# Patient Record
Sex: Female | Born: 1960 | Race: White | Hispanic: No | Marital: Single | Smoking: Current every day smoker
Health system: Southern US, Community
[De-identification: ages and names within clinical notes are randomized; demographics above are authoritative.]

## PROBLEM LIST (undated history)

## (undated) DIAGNOSIS — I671 Cerebral aneurysm, nonruptured: Secondary | ICD-10-CM

## (undated) DIAGNOSIS — J449 Chronic obstructive pulmonary disease, unspecified: Secondary | ICD-10-CM

## (undated) HISTORY — PX: BRAIN SURGERY: SHX531

---

## 2008-11-18 ENCOUNTER — Emergency Department: Payer: Self-pay | Admitting: Internal Medicine

## 2008-11-22 ENCOUNTER — Ambulatory Visit: Payer: Self-pay | Admitting: Internal Medicine

## 2009-01-12 ENCOUNTER — Ambulatory Visit: Payer: Self-pay

## 2010-01-16 ENCOUNTER — Ambulatory Visit: Payer: Self-pay | Admitting: Internal Medicine

## 2011-01-18 ENCOUNTER — Ambulatory Visit: Payer: Self-pay | Admitting: Internal Medicine

## 2012-01-24 ENCOUNTER — Ambulatory Visit: Payer: Self-pay | Admitting: Internal Medicine

## 2012-02-06 ENCOUNTER — Ambulatory Visit: Payer: Self-pay | Admitting: Internal Medicine

## 2014-12-01 ENCOUNTER — Ambulatory Visit
Admit: 2014-12-01 | Disposition: A | Discharge status: Home / Self Care | Payer: Self-pay | Attending: Family Medicine | Admitting: Family Medicine

## 2016-02-05 ENCOUNTER — Emergency Department: Payer: Medicare (Managed Care)

## 2016-02-05 ENCOUNTER — Inpatient Hospital Stay
Admission: EM | Admit: 2016-02-05 | Discharge: 2016-02-25 | DRG: 871 | Disposition: E | Payer: Medicare (Managed Care) | Attending: Pulmonary Disease | Admitting: Pulmonary Disease

## 2016-02-05 DIAGNOSIS — S42301A Unspecified fracture of shaft of humerus, right arm, initial encounter for closed fracture: Secondary | ICD-10-CM | POA: Diagnosis present

## 2016-02-05 DIAGNOSIS — N179 Acute kidney failure, unspecified: Secondary | ICD-10-CM | POA: Diagnosis present

## 2016-02-05 DIAGNOSIS — Z9181 History of falling: Secondary | ICD-10-CM | POA: Diagnosis not present

## 2016-02-05 DIAGNOSIS — E876 Hypokalemia: Secondary | ICD-10-CM

## 2016-02-05 DIAGNOSIS — E860 Dehydration: Secondary | ICD-10-CM | POA: Diagnosis present

## 2016-02-05 DIAGNOSIS — I469 Cardiac arrest, cause unspecified: Secondary | ICD-10-CM | POA: Diagnosis not present

## 2016-02-05 DIAGNOSIS — R590 Localized enlarged lymph nodes: Secondary | ICD-10-CM | POA: Insufficient documentation

## 2016-02-05 DIAGNOSIS — J9602 Acute respiratory failure with hypercapnia: Secondary | ICD-10-CM | POA: Diagnosis present

## 2016-02-05 DIAGNOSIS — Z880 Allergy status to penicillin: Secondary | ICD-10-CM

## 2016-02-05 DIAGNOSIS — F17210 Nicotine dependence, cigarettes, uncomplicated: Secondary | ICD-10-CM | POA: Diagnosis present

## 2016-02-05 DIAGNOSIS — W06XXXA Fall from bed, initial encounter: Secondary | ICD-10-CM | POA: Diagnosis present

## 2016-02-05 DIAGNOSIS — J690 Pneumonitis due to inhalation of food and vomit: Secondary | ICD-10-CM | POA: Diagnosis not present

## 2016-02-05 DIAGNOSIS — E872 Acidosis: Secondary | ICD-10-CM | POA: Diagnosis present

## 2016-02-05 DIAGNOSIS — Z66 Do not resuscitate: Secondary | ICD-10-CM | POA: Diagnosis not present

## 2016-02-05 DIAGNOSIS — M899 Disorder of bone, unspecified: Secondary | ICD-10-CM | POA: Diagnosis not present

## 2016-02-05 DIAGNOSIS — Z978 Presence of other specified devices: Secondary | ICD-10-CM

## 2016-02-05 DIAGNOSIS — S42309A Unspecified fracture of shaft of humerus, unspecified arm, initial encounter for closed fracture: Secondary | ICD-10-CM

## 2016-02-05 DIAGNOSIS — B961 Klebsiella pneumoniae [K. pneumoniae] as the cause of diseases classified elsewhere: Secondary | ICD-10-CM | POA: Diagnosis present

## 2016-02-05 DIAGNOSIS — S329XXA Fracture of unspecified parts of lumbosacral spine and pelvis, initial encounter for closed fracture: Secondary | ICD-10-CM

## 2016-02-05 DIAGNOSIS — J9601 Acute respiratory failure with hypoxia: Secondary | ICD-10-CM | POA: Diagnosis present

## 2016-02-05 DIAGNOSIS — R570 Cardiogenic shock: Secondary | ICD-10-CM | POA: Diagnosis not present

## 2016-02-05 DIAGNOSIS — G9341 Metabolic encephalopathy: Secondary | ICD-10-CM | POA: Diagnosis present

## 2016-02-05 DIAGNOSIS — A419 Sepsis, unspecified organism: Principal | ICD-10-CM

## 2016-02-05 DIAGNOSIS — Z4659 Encounter for fitting and adjustment of other gastrointestinal appliance and device: Secondary | ICD-10-CM

## 2016-02-05 DIAGNOSIS — J449 Chronic obstructive pulmonary disease, unspecified: Secondary | ICD-10-CM | POA: Diagnosis present

## 2016-02-05 DIAGNOSIS — Z8673 Personal history of transient ischemic attack (TIA), and cerebral infarction without residual deficits: Secondary | ICD-10-CM

## 2016-02-05 DIAGNOSIS — N390 Urinary tract infection, site not specified: Secondary | ICD-10-CM | POA: Diagnosis present

## 2016-02-05 DIAGNOSIS — R6521 Severe sepsis with septic shock: Secondary | ICD-10-CM | POA: Diagnosis present

## 2016-02-05 DIAGNOSIS — G931 Anoxic brain damage, not elsewhere classified: Secondary | ICD-10-CM | POA: Diagnosis present

## 2016-02-05 DIAGNOSIS — J9600 Acute respiratory failure, unspecified whether with hypoxia or hypercapnia: Secondary | ICD-10-CM | POA: Diagnosis present

## 2016-02-05 DIAGNOSIS — C779 Secondary and unspecified malignant neoplasm of lymph node, unspecified: Secondary | ICD-10-CM | POA: Diagnosis present

## 2016-02-05 DIAGNOSIS — C9000 Multiple myeloma not having achieved remission: Secondary | ICD-10-CM

## 2016-02-05 DIAGNOSIS — Z9889 Other specified postprocedural states: Secondary | ICD-10-CM

## 2016-02-05 DIAGNOSIS — R531 Weakness: Secondary | ICD-10-CM

## 2016-02-05 DIAGNOSIS — Z87820 Personal history of traumatic brain injury: Secondary | ICD-10-CM | POA: Diagnosis not present

## 2016-02-05 DIAGNOSIS — R748 Abnormal levels of other serum enzymes: Secondary | ICD-10-CM

## 2016-02-05 DIAGNOSIS — Z7982 Long term (current) use of aspirin: Secondary | ICD-10-CM

## 2016-02-05 DIAGNOSIS — Z452 Encounter for adjustment and management of vascular access device: Secondary | ICD-10-CM

## 2016-02-05 DIAGNOSIS — Z01818 Encounter for other preprocedural examination: Secondary | ICD-10-CM

## 2016-02-05 DIAGNOSIS — W19XXXA Unspecified fall, initial encounter: Secondary | ICD-10-CM

## 2016-02-05 HISTORY — DX: Cerebral aneurysm, nonruptured: I67.1

## 2016-02-05 HISTORY — DX: Chronic obstructive pulmonary disease, unspecified: J44.9

## 2016-02-05 LAB — TROPONIN I: TROPONIN I: 0.04 ng/mL — AB (ref <0.031)

## 2016-02-05 LAB — COMPREHENSIVE METABOLIC PANEL
ALT: 55 U/L — AB (ref 14–54)
AST: 89 U/L — AB (ref 15–41)
Albumin: 2.7 g/dL — ABNORMAL LOW (ref 3.5–5.0)
Alkaline Phosphatase: 116 U/L (ref 38–126)
Anion gap: 14 (ref 5–15)
BILIRUBIN TOTAL: 0.2 mg/dL — AB (ref 0.3–1.2)
BUN: 30 mg/dL — AB (ref 6–20)
CHLORIDE: 105 mmol/L (ref 101–111)
CO2: 19 mmol/L — ABNORMAL LOW (ref 22–32)
CREATININE: 1.43 mg/dL — AB (ref 0.44–1.00)
Calcium: 9.3 mg/dL (ref 8.9–10.3)
GFR, EST AFRICAN AMERICAN: 46 mL/min — AB (ref >60)
GFR, EST NON AFRICAN AMERICAN: 40 mL/min — AB (ref >60)
Glucose, Bld: 105 mg/dL — ABNORMAL HIGH (ref 65–99)
POTASSIUM: 2.4 mmol/L — AB (ref 3.5–5.1)
Sodium: 138 mmol/L (ref 135–145)
TOTAL PROTEIN: 6.7 g/dL (ref 6.5–8.1)

## 2016-02-05 LAB — URINALYSIS COMPLETE WITH MICROSCOPIC (ARMC ONLY)
Bilirubin Urine: NEGATIVE
Glucose, UA: NEGATIVE mg/dL
KETONES UR: NEGATIVE mg/dL
NITRITE: NEGATIVE
PROTEIN: 30 mg/dL — AB
Specific Gravity, Urine: 1.014 (ref 1.005–1.030)
pH: 5 (ref 5.0–8.0)

## 2016-02-05 LAB — DIFFERENTIAL
BASOS ABS: 0.1 10*3/uL (ref 0–0.1)
Basophils Relative: 0 %
EOS ABS: 0 10*3/uL (ref 0–0.7)
Eosinophils Relative: 0 %
LYMPHS ABS: 2 10*3/uL (ref 1.0–3.6)
Lymphocytes Relative: 9 %
MONO ABS: 1.1 10*3/uL — AB (ref 0.2–0.9)
Neutro Abs: 18.8 10*3/uL — ABNORMAL HIGH (ref 1.4–6.5)
Neutrophils Relative %: 86 %

## 2016-02-05 LAB — CBC
HEMATOCRIT: 28.9 % — AB (ref 35.0–47.0)
HEMOGLOBIN: 9.3 g/dL — AB (ref 12.0–16.0)
MCH: 25.9 pg — ABNORMAL LOW (ref 26.0–34.0)
MCHC: 32 g/dL (ref 32.0–36.0)
MCV: 80.7 fL (ref 80.0–100.0)
Platelets: 513 10*3/uL — ABNORMAL HIGH (ref 150–440)
RBC: 3.58 MIL/uL — ABNORMAL LOW (ref 3.80–5.20)
RDW: 15.2 % — AB (ref 11.5–14.5)
WBC: 21.9 10*3/uL — ABNORMAL HIGH (ref 3.6–11.0)

## 2016-02-05 LAB — HEMOGLOBIN AND HEMATOCRIT, BLOOD
HEMATOCRIT: 27.3 % — AB (ref 35.0–47.0)
HEMOGLOBIN: 8.7 g/dL — AB (ref 12.0–16.0)

## 2016-02-05 LAB — LACTIC ACID, PLASMA
Lactic Acid, Venous: 1 mmol/L (ref 0.5–2.0)
Lactic Acid, Venous: 1.7 mmol/L (ref 0.5–2.0)

## 2016-02-05 LAB — CK: CK TOTAL: 869 U/L — AB (ref 38–234)

## 2016-02-05 LAB — PROTIME-INR
INR: 1.22
Prothrombin Time: 15.6 seconds — ABNORMAL HIGH (ref 11.4–15.0)

## 2016-02-05 LAB — GLUCOSE, CAPILLARY: GLUCOSE-CAPILLARY: 112 mg/dL — AB (ref 65–99)

## 2016-02-05 LAB — APTT: aPTT: 24 seconds (ref 24–36)

## 2016-02-05 MED ORDER — HYDROCODONE-ACETAMINOPHEN 5-325 MG PO TABS
ORAL_TABLET | ORAL | Status: AC
  Administered 2016-02-05: 1 via ORAL
  Filled 2016-02-05: qty 2

## 2016-02-05 MED ORDER — POTASSIUM CHLORIDE IN NACL 40-0.9 MEQ/L-% IV SOLN
INTRAVENOUS | Status: DC
  Administered 2016-02-05 – 2016-02-08 (×5): 75 mL/h via INTRAVENOUS
  Filled 2016-02-05 (×7): qty 1000

## 2016-02-05 MED ORDER — POTASSIUM CHLORIDE 10 MEQ/100ML IV SOLN
10.0000 meq | Freq: Once | INTRAVENOUS | Status: AC
  Administered 2016-02-05: 23:00:00 10 meq via INTRAVENOUS
  Filled 2016-02-05: qty 100

## 2016-02-05 MED ORDER — TIOTROPIUM BROMIDE MONOHYDRATE 18 MCG IN CAPS
18.0000 ug | ORAL_CAPSULE | Freq: Every day | RESPIRATORY_TRACT | Status: DC
  Administered 2016-02-05 – 2016-02-06 (×2): 18 ug via RESPIRATORY_TRACT
  Filled 2016-02-05: qty 5

## 2016-02-05 MED ORDER — PANTOPRAZOLE SODIUM 40 MG PO TBEC
40.0000 mg | DELAYED_RELEASE_TABLET | Freq: Every day | ORAL | Status: DC
  Administered 2016-02-05 – 2016-02-08 (×4): 40 mg via ORAL
  Filled 2016-02-05 (×4): qty 1

## 2016-02-05 MED ORDER — SODIUM CHLORIDE 0.9 % IV BOLUS (SEPSIS)
1000.0000 mL | Freq: Once | INTRAVENOUS | Status: AC
  Administered 2016-02-05: 23:00:00 1000 mL via INTRAVENOUS

## 2016-02-05 MED ORDER — DEXTROSE 5 % IV SOLN
1.0000 g | Freq: Three times a day (TID) | INTRAVENOUS | Status: DC
  Administered 2016-02-06: 1 g via INTRAVENOUS
  Filled 2016-02-05 (×3): qty 1

## 2016-02-05 MED ORDER — OXYCODONE-ACETAMINOPHEN 5-325 MG PO TABS
1.0000 | ORAL_TABLET | ORAL | Status: AC
  Administered 2016-02-05: 1 via ORAL
  Filled 2016-02-05: qty 1

## 2016-02-05 MED ORDER — ACETAMINOPHEN 325 MG PO TABS: 650.0000 mg | ORAL_TABLET | Freq: Four times a day (QID) | ORAL | Status: DC | PRN

## 2016-02-05 MED ORDER — DOCUSATE SODIUM 100 MG PO CAPS
100.0000 mg | ORAL_CAPSULE | Freq: Two times a day (BID) | ORAL | Status: DC
  Administered 2016-02-05 – 2016-02-07 (×4): 100 mg via ORAL
  Filled 2016-02-05 (×5): qty 1

## 2016-02-05 MED ORDER — ACETAMINOPHEN 650 MG RE SUPP: 650.0000 mg | Freq: Four times a day (QID) | RECTAL | Status: DC | PRN

## 2016-02-05 MED ORDER — LEVOFLOXACIN IN D5W 750 MG/150ML IV SOLN
750.0000 mg | Freq: Once | INTRAVENOUS | Status: AC
  Administered 2016-02-05: 750 mg via INTRAVENOUS
  Filled 2016-02-05: qty 150

## 2016-02-05 MED ORDER — HEPARIN SODIUM (PORCINE) 5000 UNIT/ML IJ SOLN
5000.0000 [IU] | Freq: Three times a day (TID) | INTRAMUSCULAR | Status: DC
  Administered 2016-02-05 – 2016-02-07 (×5): 5000 [IU] via SUBCUTANEOUS
  Filled 2016-02-05 (×5): qty 1

## 2016-02-05 MED ORDER — SODIUM CHLORIDE 0.9 % IV BOLUS (SEPSIS)
1000.0000 mL | Freq: Once | INTRAVENOUS | Status: AC
  Administered 2016-02-05: 1000 mL via INTRAVENOUS

## 2016-02-05 MED ORDER — VITAMIN D (ERGOCALCIFEROL) 1.25 MG (50000 UNIT) PO CAPS: 50000.0000 [IU] | ORAL_CAPSULE | ORAL | Status: DC

## 2016-02-05 MED ORDER — LEVOFLOXACIN IN D5W 750 MG/150ML IV SOLN: 750.0000 mg | INTRAVENOUS | Status: DC

## 2016-02-05 MED ORDER — ONDANSETRON HCL 4 MG/2ML IJ SOLN: 4.0000 mg | Freq: Four times a day (QID) | INTRAMUSCULAR | Status: DC | PRN

## 2016-02-05 MED ORDER — MAGNESIUM SULFATE 2 GM/50ML IV SOLN
2.0000 g | Freq: Once | INTRAVENOUS | Status: AC
  Administered 2016-02-05: 2 g via INTRAVENOUS
  Filled 2016-02-05: qty 50

## 2016-02-05 MED ORDER — ONDANSETRON HCL 4 MG PO TABS
4.0000 mg | ORAL_TABLET | Freq: Four times a day (QID) | ORAL | Status: DC | PRN
  Administered 2016-02-07 – 2016-02-08 (×2): 4 mg via ORAL
  Filled 2016-02-05: qty 1

## 2016-02-05 MED ORDER — ASPIRIN EC 81 MG PO TBEC
81.0000 mg | DELAYED_RELEASE_TABLET | Freq: Every day | ORAL | Status: DC
  Administered 2016-02-05 – 2016-02-08 (×4): 81 mg via ORAL
  Filled 2016-02-05 (×4): qty 1

## 2016-02-05 MED ORDER — DEXTROSE 5 % IV SOLN
2.0000 g | Freq: Once | INTRAVENOUS | Status: AC
  Administered 2016-02-05: 2 g via INTRAVENOUS
  Filled 2016-02-05: qty 2

## 2016-02-05 MED ORDER — MOMETASONE FURO-FORMOTEROL FUM 200-5 MCG/ACT IN AERO
2.0000 | INHALATION_SPRAY | Freq: Two times a day (BID) | RESPIRATORY_TRACT | Status: DC
  Administered 2016-02-05 – 2016-02-06 (×3): 2 via RESPIRATORY_TRACT
  Filled 2016-02-05: qty 8.8

## 2016-02-05 MED ORDER — SODIUM CHLORIDE 0.9% FLUSH
3.0000 mL | Freq: Two times a day (BID) | INTRAVENOUS | Status: DC
  Administered 2016-02-05 – 2016-02-08 (×6): 3 mL via INTRAVENOUS

## 2016-02-05 MED ORDER — POTASSIUM CHLORIDE CRYS ER 20 MEQ PO TBCR
40.0000 meq | EXTENDED_RELEASE_TABLET | Freq: Once | ORAL | Status: AC
  Administered 2016-02-05: 40 meq via ORAL
  Filled 2016-02-05: qty 2

## 2016-02-05 MED ORDER — MORPHINE SULFATE (PF) 2 MG/ML IV SOLN
2.0000 mg | INTRAVENOUS | Status: DC | PRN
  Administered 2016-02-05 – 2016-02-07 (×3): 2 mg via INTRAVENOUS
  Filled 2016-02-05 (×3): qty 1

## 2016-02-05 MED ORDER — IPRATROPIUM-ALBUTEROL 0.5-2.5 (3) MG/3ML IN SOLN
3.0000 mL | RESPIRATORY_TRACT | Status: DC | PRN
  Administered 2016-02-08: 10:00:00 3 mL via RESPIRATORY_TRACT
  Filled 2016-02-05: qty 3

## 2016-02-05 MED ORDER — SERTRALINE HCL 50 MG PO TABS
100.0000 mg | ORAL_TABLET | Freq: Every day | ORAL | Status: DC
  Administered 2016-02-05 – 2016-02-07 (×3): 100 mg via ORAL
  Filled 2016-02-05 (×3): qty 1

## 2016-02-05 MED ORDER — LOPERAMIDE HCL 2 MG PO CAPS: 2.0000 mg | ORAL_CAPSULE | Freq: Two times a day (BID) | ORAL | Status: DC | PRN

## 2016-02-05 MED ORDER — HYDROCODONE-ACETAMINOPHEN 5-325 MG PO TABS
1.0000 | ORAL_TABLET | ORAL | Status: DC | PRN
  Administered 2016-02-05: 1 via ORAL
  Administered 2016-02-05 – 2016-02-08 (×7): 2 via ORAL
  Filled 2016-02-05: qty 1
  Filled 2016-02-05 (×6): qty 2

## 2016-02-05 MED ORDER — BISACODYL 10 MG RE SUPP: 10.0000 mg | Freq: Every day | RECTAL | Status: DC | PRN

## 2016-02-05 NOTE — H&P (Signed)
History and Physical    Leah Wyatt [id number] DOB: 12-Jan-1961 DOA: 02/13/2016  Referring physician: Dr. Jacqualine Code PCP: No primary care provider on file.  Specialists:none  Chief Complaint: weakness, right arm pain  HPI: Leah Wyatt is a 55 y.o. female has a past medical history significant for COPD and previous CVA with aneurysm clipping now with progressive weakness and right arm pain. Pt fell out of bed earlier today. No fever. No N/V/D. Denies CP or SOB. Was brought to the ER due to weakness and arm pain where she was found to be hypotensive with WBC=22k and UTI. Also noted was a right humeral fracture and K+=2.4. She is now admitted.  Review of Systems: The patient denies anorexia, fever, weight loss,, vision loss, decreased hearing, hoarseness, chest pain, syncope, dyspnea on exertion, peripheral edema, balance deficits, hemoptysis, abdominal pain, melena, hematochezia, severe indigestion/heartburn, hematuria, incontinence, genital sores, muscle weakness, suspicious skin lesions, transient blindness, difficulty walking, depression, unusual weight change, abnormal bleeding, enlarged lymph nodes, angioedema, and breast masses.   Past Medical History  Diagnosis Date  . Brain aneurysm   . COPD (chronic obstructive pulmonary disease) [facility])    Past Surgical History  Procedure Laterality Date  . Brain surgery     Social History:  reports that she has been smoking.  She does not have any smokeless tobacco history on file. She reports that she does not drink alcohol. Her drug history is not on file.  Allergies  Allergen Reactions  . Penicillins     Family History  Problem Relation Age of Onset  . Family history unknown: Yes    Prior to Admission medications   Medication Sig Start Date End Date Taking? Authorizing Provider  loperamide (IMODIUM) 2 MG capsule Take by mouth as needed for diarrhea or loose stools.   Yes Historical Provider, MD   Physical  Exam: Filed Vitals:   02/06/2016 1608 02/12/2016 1609 02/20/2016 1625  BP: 94/50    Pulse: 88    Temp:   97.9 F (36.6 C)  Resp: 16    SpO2: 98% 98%      General:  No apparent distress, WDWN, [place]/AT  Eyes: PERRL, EOMI, no scleral icterus, conjunctiva clear  ENT: moist oropharynx without lesions, TM's benign, dentition fair  Neck: supple, no lymphadenopathy. No bruits or thyromegaly  Cardiovascular: regular rate without MRG; 2+ peripheral pulses, no JVD, no peripheral edema  Respiratory: CTA biL, good air movement without wheezing, rhonchi or crackled, respiratory effort normal  Abdomen: soft, non tender to palpation, positive bowel sounds, no guarding, no rebound  Skin: no rashes or lesions  Musculoskeletal: normal bulk and tone, no joint swelling  Psychiatric: normal mood and affect, A&OX3  Neurologic: CN 2-12 grossly intact, Motor strength 5/5 in all 4 groups with symmetric DTR's and non-focal sensory exam  Labs on Admission:  Basic Metabolic Panel:  Recent Labs Lab 02/10/2016 1619  NA 138  K 2.4*  CL 105  CO2 19*  GLUCOSE 105*  BUN 30*  CREATININE 1.43*  CALCIUM 9.3   Liver Function Tests:  Recent Labs Lab 01/27/2016 1619  AST 89*  ALT 55*  ALKPHOS 116  BILITOT 0.2*  PROT 6.7  ALBUMIN 2.7*   No results for input(s): LIPASE, AMYLASE in the last 168 hours. No results for input(s): AMMONIA in the last 168 hours. CBC:  Recent Labs Lab 02/10/2016 1619  WBC 21.9*  NEUTROABS 18.8*  HGB 9.3*  HCT 28.9*  MCV 80.7  PLT 513*  Cardiac Enzymes:  Recent Labs Lab 02/22/2016 1619  CKTOTAL 869*  TROPONINI 0.04*    BNP (last 3 results) No results for input(s): BNP in the last 8760 hours.  ProBNP (last 3 results) No results for input(s): PROBNP in the last 8760 hours.  CBG:  Recent Labs Lab 02/23/2016 1603  GLUCAP 112*    Radiological Exams on Admission: Dg Chest 1 View  01/28/2016  CLINICAL DATA:  Fall from bed last night, right humeral pain. VP  shunt. Lytic destructive lesion in the right superior pubic ramus seen on radiographs today. EXAM: CHEST 1 VIEW COMPARISON:  None. FINDINGS: VP shunt tubing intact were visualized. There is a metal density in the right neck projecting over the shunt on the frontal projection. Linear subsegmental atelectasis or scarring in the lingula. Slight lobularity at the right cardiophrenic junction, probably incidental. Low lung volumes. The patient is rotated to the right on today's radiograph, reducing diagnostic sensitivity and specificity. I do not appreciate a thoracic bony abnormality. 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 IMPRESSION: 1. Displaced right humeral surgical neck fracture, much better seen on the dedicated humeral radiographs. 2. VP shunt tubing intact over the chest. 3. Linear subsegmental atelectasis in the lingula. 4. Slight lobularity along the right cardiophrenic junction, probably from epicardial adipose tissue or a small pericardial cyst. In light of the heightened concern for malignancy based on the lytic lesion in the right superior pubic ramus, this might be further characterized with CT, if clinically warranted. Electronically Signed   By: Van Clines M.D.   On: 02/14/2016 17:10   Dg Pelvis 1-2 Views  02/06/2016  CLINICAL DATA:  Fall on bed last night.  Pain in the right femur. EXAM: PELVIS - 1-2 VIEW COMPARISON:  02/18/2016 radiograph FINDINGS: Destructive lytic lesion of the right superior pubic ramus. Possible bony fragmentation. The right inferior pubic ramus spurs intact. I do not see any other definite destructive pelvic lesions although there is some asymmetry in density along the iliac bones which might be attributable soft tissues. Coil tubing in the pelvis from VP shunt. IMPRESSION: 1. Lytic destructive bony lesion in the right superior pubic ramus. Possible underlying pathologic fracture, with  very little bony mineralization in this region and also bony expansion. Appearance highly concerning for malignancy such as myeloma or bony metastatic disease. I doubt that this is a bland fracture. Workup for myeloma or other source of malignancy recommended. These results will be called to the ordering clinician or representative by the Radiologist Assistant, and communication documented in the PACS or zVision Dashboard. Electronically Signed   By: Van Clines M.D.   On: 02/06/2016 17:06   Ct Head Wo Contrast  02/13/2016  CLINICAL DATA:  Right-sided weakness and slurred speech. History of aneurysm repair and shunt placement. EXAM: CT HEAD WITHOUT CONTRAST TECHNIQUE: Contiguous axial images were obtained from the base of the skull through the vertex without intravenous contrast. COMPARISON:  11/18/2008 FINDINGS: Sinuses/Soft tissues: Left frontal craniotomy for aneurysm repair. Clear paranasal sinuses and mastoid air cells. Intracranial: Cerebral atrophy. Moderate low density in the periventricular white matter likely related to small vessel disease. More focal encephalomalacia involving the medial frontal lobes. This is felt to be similar to on the most recent exams. Right-sided VP shunt catheter terminates the left lateral ventricle, without hydronephrosis. No cortically based infarct, hydrocephalus, hemorrhage, or mass lesion identified. IMPRESSION: 1.  No acute intracranial abnormality. 2. Status post  aneurysm clipping within the frontal lobe, likely anterior communicating artery. Surrounding encephalomalacia. 3. Right-sided VP shunt catheter in place without hydrocephalus. 4. Small vessel ischemic change. Report called to Dr. Jacqualine Code at 3:59 p.m. Electronically Signed   By: Abigail Miyamoto M.D.   On: 02/07/2016 16:02   Dg Humerus Right  02/04/2016  CLINICAL DATA:  Pt fell out of bed last night and is now complaining of right humeral pain and pain in right femur. EXAM: RIGHT HUMERUS - 2+ VIEW  COMPARISON:  None. FINDINGS: Comminuted fracture surgical neck of the humerus. Three dominant fracture fragments. Fracture fragments show mild to moderate displacement. IMPRESSION: Fracture humerus Electronically Signed   By: Skipper Cliche M.D.   On: 01/27/2016 17:01   Dg Femur, Min 2 Views Right  02/07/2016  CLINICAL DATA:  [place] out of bat last night, right femur pain EXAM: RIGHT FEMUR 2 VIEWS COMPARISON:  None. FINDINGS: Four views of the right femur submitted. No femur fracture or subluxation. There is cortical irregularity with lytic appearance right superior pubic ramus. Pathologic fracture or lytic bone lesion cannot be excluded. Clinical correlation is necessary. Further correlation with MRI or CT scan could be performed. IMPRESSION: No femur fracture or subluxation. There is cortical irregularity with lytic appearance right superior pubic ramus. Pathologic fracture or lytic bone lesion cannot be excluded. Clinical correlation is necessary. Further correlation with MRI or CT scan could be performed. Electronically Signed   By: Lahoma Crocker M.D.   On: 02/17/2016 17:06    EKG: Independently reviewed.  Assessment/Plan Principal Problem:   Sepsis ([place]) Active Problems:   UTI (urinary tract infection)   Right humeral fracture   COPD (chronic obstructive pulmonary disease) ([place])   Will admit to floor with IV fluids and IV ABX. Cultures sent. Supplement K+. Consult Ortho, PT, and care management. Repeat labs in AM.  Diet: regular Fluids: NS with K+@75  DVT Prophylaxis: SQ Heparin  Code Status: FULL  Family Communication: yes  Disposition Plan: home  Time spent: 50 min

## 2016-02-05 NOTE — ED Notes (Signed)
Pt transported to CT ?

## 2016-02-05 NOTE — ED Provider Notes (Addendum)
[facility] Provider Note  ____________________________________________  Time seen: Approximately 4:01 PM  I have reviewed the triage vital signs and the nursing notes.   HISTORY  Chief Complaint Code Stroke Right sided upper arm pain   HPI Leah [facility] is a 55 y.o. female presents for evaluation after falling out of bed. She reports that this morning she got out of bed, and fell forward landing onto her right side. She banged her right arm, and she has bruising and pain over the right upper arm. She's not been able to use the right upper arm because it hurts so much to move. She is not a pain in the right hand or elbow. All the pain is located just below the right shoulder. No chest pain shortness of breath or trouble breathing. No recent fevers chills or infection. She did pull a muscle in her right lower leg/thigh about a week ago and has seen a they're treating her for muscle strain. She reports she does have some slight weakness in the right leg due to pain when she tries to lift it up, but has been ongoing for about a week. No new numbness tingling or obvious weakness like inability to move or like the nurse will work. Family does report occasionally her speech is slightly slurred, however this is been the case since she had a brain aneurysm years ago.  No new facial droop. No new numbness tingling or weakness.  No neck injury. Did not lose consciousness. Family had to assist her off the floor this afternoon.  Past Medical History  Diagnosis Date  . Brain aneurysm     There are no active problems to display for this patient.   Past Surgical History  Procedure Laterality Date  . Brain surgery      Current Outpatient Rx  Name  Route  Sig  Dispense  Refill  . loperamide (IMODIUM) 2 MG capsule   Oral   Take by mouth as needed for diarrhea or loose stools.          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 Allergies Penicillins Penicillin  No family history on file.  Social History Social History  Substance Use Topics  . Smoking status: Current Every Day Smoker  . Smokeless tobacco: None  . Alcohol Use: No    Review of Systems Constitutional: No fever/chills Eyes: No visual changes. ENT: No sore throat. Cardiovascular: Denies chest pain. Respiratory: Denies shortness of breath. Gastrointestinal: No abdominal pain.  No nausea, no vomiting.  No diarrhea.  No constipation. Genitourinary: Negative for dysuria. Musculoskeletal: See history of present illness Skin: Negative for rash. Neurological: Negative for headaches, focal weakness or numbness. He has severe short-term memory difficulties due to her previous aneurysm.  10-point ROS otherwise negative.  ____________________________________________   PHYSICAL EXAM:  VITAL SIGNS: ED Triage Vitals  Enc Vitals Group     BP --      Pulse --      Resp --      Temp --      Temp src --      SpO2 --      Weight --      Height --      Head Cir --      Peak Flow --      Pain Score --      Pain Loc --  Pain Edu? --      Excl. in [place]? --    Constitutional: Alert and oriented. Well appearing and in no acute distress. Eyes: Conjunctivae are normal. PERRL. EOMI. Head: Atraumatic. Nose: No congestion/rhinnorhea. Mouth/Throat: Mucous membranes are moist.  Oropharynx non-erythematous. Neck: No stridor.  No cervical spine tenderness Cardiovascular: Normal rate, regular rhythm. Grossly normal heart sounds.  Good peripheral circulation. Respiratory: Normal respiratory effort.  No retractions. Lungs CTAB. Gastrointestinal: Soft and nontender. No distention. No abdominal bruits. No CVA tenderness. Musculoskeletal:   Lower Extremities  No edema. Normal DP/PT pulses bilateral with good cap refill.  Normal neuro-motor function lower extremities bilateral.  RIGHT Right lower  extremity demonstrates normal strength, good use of all muscles that she does complain of slight achiness with extension of the right hip, though no evidence of deformity shortening or rotation. No pain on axial loading.. No edema bruising or contusions of the right hip, right knee, right ankle. Full range of motion of the right lower extremity without pain. No pain on axial loading. No evidence of trauma.  LEFT Left lower extremity demonstrates normal strength, good use of all muscles. No edema bruising or contusions of the hip,  knee, ankle. Full range of motion of the left lower extremity without pain. No pain on axial loading. No evidence of trauma.  RIGHT Right upper extremity demonstrates normal strength, good use of all muscles except for limitation of movement at the right shoulder due to pain. No edema bruising or contusions right elbow, right forearm / hand. Full range of motion of the right right upper extremity without pain septum at the right shoulder. The patient has moderate bruising about the size of the hand/palm overlying the right upper arm without obvious deformity but pain limits range of motion of the right upper arm. No tenderness overlying the anterior shoulder. Strong radial pulse. Intact median/ulnar/radial neuro-muscular exam.  LEFT Left upper extremity demonstrates normal strength, good use of all muscles. No edema bruising or contusions of the left shoulder/upper arm, left elbow, left forearm / hand. Full range of motion of the left  upper extremity without pain. No evidence of trauma. Strong radial pulse. Intact median/ulnar/radial neuro-muscular exam.   Neurologic:  Normal speech and language. No gross focal neurologic deficits are appreciated. No gait instability. Skin:  Skin is warm, dry and intact. No rash noted. Psychiatric: Mood and affect are normal. Speech and behavior are normal.  ____________________________________________   LABS (all labs ordered are  listed, but only abnormal results are displayed)  Labs Reviewed  PROTIME-INR - Abnormal; Notable for the following:    Prothrombin Time 15.6 (*)    All other components within normal limits  CBC - Abnormal; Notable for the following:    WBC 21.9 (*)    RBC 3.58 (*)    Hemoglobin 9.3 (*)    HCT 28.9 (*)    MCH 25.9 (*)    RDW 15.2 (*)    Platelets 513 (*)    All other components within normal limits  DIFFERENTIAL - Abnormal; Notable for the following:    Neutro Abs 18.8 (*)    Monocytes Absolute 1.1 (*)    All other components within normal limits  COMPREHENSIVE METABOLIC PANEL - Abnormal; Notable for the following:    Potassium 2.4 (*)    CO2 19 (*)    Glucose, Bld 105 (*)    BUN 30 (*)    Creatinine, Ser 1.43 (*)    Albumin 2.7 (*)  AST 89 (*)    ALT 55 (*)    Total Bilirubin 0.2 (*)    GFR calc non Af Amer 40 (*)    GFR calc Af Amer 46 (*)    All other components within normal limits  TROPONIN I - Abnormal; Notable for the following:    Troponin I 0.04 (*)    All other components within normal limits  GLUCOSE, CAPILLARY - Abnormal; Notable for the following:    Glucose-Capillary 112 (*)    All other components within normal limits  CK - Abnormal; Notable for the following:    Total CK 869 (*)    All other components within normal limits  URINALYSIS COMPLETEWITH MICROSCOPIC ([facility] ONLY) - Abnormal; Notable for the following:    Color, Urine YELLOW (*)    APPearance CLOUDY (*)    Hgb urine dipstick 1+ (*)    Protein, ur 30 (*)    Leukocytes, UA 1+ (*)    Bacteria, UA MANY (*)    Squamous Epithelial / LPF 0-5 (*)    All other components within normal limits  CULTURE, BLOOD (ROUTINE X 2)  CULTURE, BLOOD (ROUTINE X 2)  URINE CULTURE  APTT  LACTIC ACID, PLASMA  LACTIC ACID, PLASMA  HEMOGLOBIN AND HEMATOCRIT, BLOOD  BLOOD GAS, VENOUS  PROTEIN ELECTROPHORESIS, SERUM  UPEP/TP, 24-HR URINE  CBG MONITORING, ED    ____________________________________________  EKG  Reviewed and interpreted me at 1602 Normal sinus rhythm Heart rate 90 QRS 110 QTc 470 Evidence of possible old inferior Q waves, there is no evidence of acute ischemic change noted. ____________________________________________  [id number]   DG HumerUS Right (Final result) Result time: 02/22/2016 17:01:46   Final result by Rad Results In Interface (02/17/2016 17:01:46)   Narrative:   CLINICAL DATA: Pt fell out of bed last night and is now complaining of right humeral pain and pain in right femur.  EXAM: RIGHT HUMERUS - 2+ VIEW  COMPARISON: None.  FINDINGS: Comminuted fracture surgical neck of the humerus. Three dominant fracture fragments. Fracture fragments show mild to moderate displacement.  IMPRESSION: Fracture humerus   Electronically Signed By: Skipper Cliche M.D. On: 01/26/2016 17:01          DG FEMUR, MIN 2 VIEWS RIGHT (Final result) Result time: 01/26/2016 17:06:01   Final result by Rad Results In Interface (02/04/2016 17:06:01)   Narrative:   CLINICAL DATA: [place] out of bat last night, right femur pain  EXAM: RIGHT FEMUR 2 VIEWS  COMPARISON: None.  FINDINGS: Four views of the right femur submitted. No femur fracture or subluxation. There is cortical irregularity with lytic appearance right superior pubic ramus. Pathologic fracture or lytic bone lesion cannot be excluded. Clinical correlation is necessary. Further correlation with MRI or CT scan could be performed.  IMPRESSION: No femur fracture or subluxation. There is cortical irregularity with lytic appearance right superior pubic ramus. Pathologic fracture or lytic bone lesion cannot be excluded. Clinical correlation is necessary. Further correlation with MRI or CT scan could be performed.   Electronically Signed By: Lahoma Crocker M.D. On: 02/22/2016 17:06          DG Pelvis 1-2 Views (Final result) Result time:  02/09/2016 17:06:27   Final result by Rad Results In Interface (02/06/2016 17:06:27)   Narrative:   CLINICAL DATA: Fall on bed last night. Pain in the right femur.  EXAM: PELVIS - 1-2 VIEW  COMPARISON: 01/31/2016 radiograph  FINDINGS: Destructive lytic lesion of the right superior pubic ramus. Possible bony fragmentation.  The right inferior pubic ramus spurs intact. I do not see any other definite destructive pelvic lesions although there is some asymmetry in density along the iliac bones which might be attributable soft tissues.  Coil tubing in the pelvis from VP shunt.  IMPRESSION: 1. Lytic destructive bony lesion in the right superior pubic ramus. Possible underlying pathologic fracture, with very little bony mineralization in this region and also bony expansion. Appearance highly concerning for malignancy such as myeloma or bony metastatic disease. I doubt that this is a bland fracture. Workup for myeloma or other source of malignancy recommended. These results will be called to the ordering clinician or representative by the Radiologist Assistant, and communication documented in the PACS or zVision Dashboard.   Electronically Signed By: Van Clines M.D. On: 02/23/2016 17:06          DG Chest 1 View (Final result) Result time: 02/13/2016 17:10:42   Procedure changed from [facility] Chest 2 View      Final result by Rad Results In Interface (02/24/2016 17:10:42)   Narrative:   CLINICAL DATA: Fall from bed last night, right humeral pain. VP shunt. Lytic destructive lesion in the right superior pubic ramus seen on radiographs today.  EXAM: CHEST 1 VIEW  COMPARISON: None.  FINDINGS: VP shunt tubing intact were visualized. There is a metal density in the right neck projecting over the shunt on the frontal projection.  Linear subsegmental atelectasis or scarring in the lingula. Slight lobularity at the right cardiophrenic junction, probably  incidental. Low lung volumes. The patient is rotated to the right on today's radiograph, reducing diagnostic sensitivity and specificity.  I do not appreciate a thoracic bony abnormality. 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  IMPRESSION: 1. Displaced right humeral surgical neck fracture, much better seen on the dedicated humeral radiographs. 2. VP shunt tubing intact over the chest. 3. Linear subsegmental atelectasis in the lingula. 4. Slight lobularity along the right cardiophrenic junction, probably from epicardial adipose tissue or a small pericardial cyst. In light of the heightened concern for malignancy based on the lytic lesion in the right superior pubic ramus, this might be further characterized with CT, if clinically warranted.   Electronically Signed By: Van Clines M.D. On: 02/04/2016 17:10          CT Head Wo Contrast (Final result) Result time: 02/09/2016 16:02:40   Final result by Rad Results In Interface (02/04/2016 16:02:40)   Narrative:   CLINICAL DATA: Right-sided weakness and slurred speech. History of aneurysm repair and shunt placement.  EXAM: CT HEAD WITHOUT CONTRAST  TECHNIQUE: Contiguous axial images were obtained from the base of the skull through the vertex without intravenous contrast.  COMPARISON: 11/18/2008  FINDINGS: Sinuses/Soft tissues: Left frontal craniotomy for aneurysm repair. Clear paranasal sinuses and mastoid air cells.  Intracranial: Cerebral atrophy. Moderate low density in the periventricular white matter likely related to small vessel disease. More focal encephalomalacia involving the medial frontal lobes. This is felt to be similar to on the most recent exams. Right-sided VP shunt catheter terminates the left lateral ventricle, without hydronephrosis. No cortically based infarct, hydrocephalus, hemorrhage, or mass lesion  identified.  IMPRESSION: 1. No acute intracranial abnormality. 2. 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 Surrounding encephalomalacia. 3. Right-sided VP shunt catheter in place without hydrocephalus. 4. Small vessel ischemic change. Report called to Dr. Jacqualine Code at 3:59 p.m.   Electronically Signed By: Marylyn Ishihara  Jobe Igo M.D. On: 02/03/2016 16:02    ____________________________________________   PROCEDURES  Procedure(s) performed: None  Critical Care performed: Yes, see critical care note(s) CRITICAL CARE Performed by: Delman Kitten   Total critical care time: 45 minutes  Critical care time was exclusive of separately billable procedures and treating other patients.  Critical care was necessary to treat or prevent imminent or life-threatening deterioration.  Critical care was time spent personally by me on the following activities: development of treatment plan with patient and/or surrogate as well as nursing, discussions with consultants, evaluation of patient's response to treatment, examination of patient, obtaining history from patient or surrogate, ordering and performing treatments and interventions, ordering and review of laboratory studies, ordering and review of radiographic studies, pulse oximetry and re-evaluation of patient's condition.  Patient seen and evaluated emergently as a "code stroke" due to new onset right-sided weakness after falling. The patient is not found to be a potential candidate for TPA as she has a history of a previous "brain aneurysm."  Patient is also had a done to have slurring of her T waves with hypokalemia, felt to be at a critical level less than 2.5 requiring IV replacement. She has noted leukocytosis, mild hypotension though does lack fever or any clear infectious symptoms. We will perform a sepsis workup, as we continued to evaluate the etiology of today's symptoms  hypotension. ____________________________________________   INITIAL IMPRESSION / ASSESSMENT AND PLAN / ED COURSE  Pertinent labs & imaging results that were available during my care of the patient were reviewed by me and considered in my medical decision making (see chart for details).  Neurology does not feel acute abnormality, and I would agree appears likely weakness is related to falling and breaking her arm. She does have a proximal humerus fracture which will be placed in sling and swath that she is neurologically and musculoskeletal intact distally to the injury. She does have T-wave abnormality, hypokalemia, hypotension, leukocytosis all very suspicious for acute process but no evidence support an obvious cardiac or pulmonary etiology or need for immediate antibiotics unless her tests is a dictation area continue with normal saline, hydration, continue to monitor symptoms closely.  ----------------------------------------- 5:38 PM on 02/19/2016 -----------------------------------------  Urinalysis consistent with UTI. Code sepsis paged out at this time. IV antibiotic written for, patient reports severe allergy penicillin. She is awake alert and mentating at this time without distress. Understanding of plan for admission and need for further workup regarding fracture including that her pelvis which is suspicious for pathologic.  ED Sepsis - Repeat Assessment   Performed at:    6 PM on 02/07/2016  Last Vitals:    Blood pressure 94/50, pulse 88, temperature 97.9 F (36.6 C), resp. rate 16, SpO2 98 %.  Heart:      Clear tones normal  Lungs:     Clear, no distress  Capillary Refill:   Normal less than 2 second  Peripheral Pulse (include location): Right radial   Skin (include color):   Slightly pale, cool peripherally   ----------------------------------------- 5:41 PM on 01/29/2016 -----------------------------------------  Patient discussed with and being admitted by Dr.  Doy Hutching at this time. ____________________________________________   FINAL CLINICAL IMPRESSION(S) / ED DIAGNOSES  Final diagnoses:  Hypokalemia  Closed right humeral fracture, initial encounter  Weakness  Elevated creatine kinase  Closed fracture of pelvis, unspecified part of pelvis, initial encounter ([place])  Acute urinary tract infection Sepsis    Delman Kitten, MD 02/19/2016 1742  Delman Kitten, MD 02/24/2016 1931

## 2016-02-05 NOTE — ED Notes (Signed)
Pt came to ED via pov. Per family, called pt and talked to her around 0900. Another family member tried to call her and she did not answer. Pts family member went to pts house and found pt on ground around 1500. Pt fell out of bed. C/o right arm and leg pain. Bruising to right upper arm.  Pt was c/o right leg pain. Pt has history of brain aneyrysm. Reports not good with short term memory. Pt reports weakness, family reports pts speech is different.

## 2016-02-05 NOTE — Progress Notes (Addendum)
ANTIBIOTIC CONSULT NOTE - INITIAL  Pharmacy Consult for Levaquin , Aztreonam  Indication: UTI  Allergies  Allergen Reactions  . Penicillins Hives and Swelling    Has patient had a PCN reaction causing immediate rash, facial/tongue/throat swelling, SOB or lightheadedness with hypotension: Yes Has patient had a PCN reaction causing severe rash involving mucus membranes or skin necrosis: No Has patient had a PCN reaction that required hospitalization No Has patient had a PCN reaction occurring within the last 10 years: not sure :If all of the above answers are "NO", then may proceed with Cephalosporin use.  Marland Kitchen Amoxicillin Rash    Patient Measurements: Height: 5\' 4"  (162.6 cm) Weight: 145 lb 3 oz (65.857 kg) IBW/kg (Calculated) : 54.7 Adjusted Body Weight:   Vital Signs: Temp: 97.5 F (36.4 C) (06/11 2027) Temp Source: Oral (06/11 2027) BP: 149/63 mmHg (06/11 2027) Pulse Rate: 73 (06/11 2027) Intake/Output from previous day:   Intake/Output from this shift:    Labs:  Recent Labs  02/07/2016 1619 02/23/2016 1956  WBC 21.9*  --   HGB 9.3* 8.7*  PLT 513*  --   CREATININE 1.43*  --    Estimated Creatinine Clearance: 41.1 mL/min (by C-G formula based on Cr of 1.43). No results for input(s): VANCOTROUGH, VANCOPEAK, VANCORANDOM, GENTTROUGH, GENTPEAK, GENTRANDOM, TOBRATROUGH, TOBRAPEAK, TOBRARND, AMIKACINPEAK, AMIKACINTROU, AMIKACIN in the last 72 hours.   Microbiology: No results found for this or any previous visit (from the past 720 hour(s)).  Medical History: Past Medical History  Diagnosis Date  . Brain aneurysm   . COPD (chronic obstructive pulmonary disease) (HCC)     Medications:  Prescriptions prior to admission  Medication Sig Dispense Refill Last Dose  . acetaminophen (TYLENOL) 500 MG tablet Take 500 mg by mouth every 6 (six) hours as needed for mild pain or moderate pain.   unknown  . aspirin EC 81 MG tablet Take 81 mg by mouth at bedtime. *Take with food.*    02/04/2016 at Unknown time  . calcium carbonate (TUMS) 500 MG chewable tablet Chew 500 mg by mouth 3 (three) times daily as needed for indigestion or heartburn.   unknown  . ibuprofen (ADVIL,MOTRIN) 600 MG tablet Take 600 mg by mouth See admin instructions. Take 1 tablet by mouth every 6 to 8 hours as needed for pain. *Take with food*   02/17/2016 at Unknown time  . loperamide (IMODIUM) 2 MG capsule Take 2 mg by mouth 2 (two) times daily as needed for diarrhea or loose stools.    unknown  . Polyethyl Glycol-Propyl Glycol (SYSTANE) 0.4-0.3 % SOLN Apply 2 drops to eye every 8 (eight) hours as needed. For dry eyes.   unknown  . predniSONE (DELTASONE) 10 MG tablet Take 10-60 mg by mouth See admin instructions. *Take 6 tablets (60 mg) by mouth on day 1, then 5 tablets (50 mg) by mouth on day 2, then 4 tablets (40 mg) by mouth on day 3, then 3 tablets (30 mg) by mouth on day 4, then take 2 tablets (20 mg) by mouth on day 5, then take 1 tablet by mouth on day 6, then stop.*   02/04/2016 at Unknown time  . sertraline (ZOLOFT) 100 MG tablet Take 100 mg by mouth at bedtime.   unknown  . Vitamin D, Ergocalciferol, (DRISDOL) 50000 units CAPS capsule Take 50,000 Units by mouth See admin instructions. *Take 50,000 units by mouth on the first Monday of the month at bedtime.*   01/30/2016   Scheduled:  . aspirin  EC  81 mg Oral Daily  . [START ON 02/06/2016] aztreonam  1 g Intravenous Q8H  . docusate sodium  100 mg Oral BID  . heparin  5,000 Units Subcutaneous Q8H  . HYDROcodone-acetaminophen      . levofloxacin (LEVAQUIN) IV  750 mg Intravenous Q48H  . mometasone-formoterol  2 puff Inhalation BID  . pantoprazole  40 mg Oral Daily  . potassium chloride  10 mEq Intravenous Once  . sertraline  100 mg Oral QHS  . sodium chloride  1,000 mL Intravenous Once  . sodium chloride  1,000 mL Intravenous Once  . sodium chloride flush  3 mL Intravenous Q12H  . tiotropium  18 mcg Inhalation Daily   Assessment: CrCl = 41.1  ml/min   Goal of Therapy:  resolution of infection  Plan:  Expected duration 7 days with resolution of temperature and/or normalization of WBC   Aztreonam 1 gm IV Q8H ordered to start on 6/12 @ 02:00.   Levaquin 750 mg IV Q48H on 6/13 @ 18:00.   Reginald Mangels D 02/18/2016,9:39 PM

## 2016-02-05 NOTE — ED Notes (Signed)
Potassium 2.4. Toponin 0.04. MD notified.

## 2016-02-05 NOTE — ED Notes (Signed)
Attempted to call report to floor RN, RN unavailable at this time.  Ascom number given and she will return my call at her earliest convenience.

## 2016-02-05 NOTE — ED Notes (Signed)
Shoulder immobilizer applied .

## 2016-02-06 ENCOUNTER — Inpatient Hospital Stay: Payer: Medicare (Managed Care)

## 2016-02-06 LAB — BLOOD CULTURE ID PANEL (REFLEXED)
Acinetobacter baumannii: NOT DETECTED
CANDIDA PARAPSILOSIS: NOT DETECTED
CANDIDA TROPICALIS: NOT DETECTED
CARBAPENEM RESISTANCE: NOT DETECTED
Candida albicans: NOT DETECTED
Candida glabrata: NOT DETECTED
Candida krusei: NOT DETECTED
ENTEROBACTER CLOACAE COMPLEX: NOT DETECTED
ENTEROBACTERIACEAE SPECIES: NOT DETECTED
ESCHERICHIA COLI: NOT DETECTED
Enterococcus species: NOT DETECTED
HAEMOPHILUS INFLUENZAE: NOT DETECTED
KLEBSIELLA PNEUMONIAE: NOT DETECTED
Klebsiella oxytoca: NOT DETECTED
LISTERIA MONOCYTOGENES: NOT DETECTED
METHICILLIN RESISTANCE: NOT DETECTED
Neisseria meningitidis: NOT DETECTED
PROTEUS SPECIES: NOT DETECTED
Pseudomonas aeruginosa: NOT DETECTED
SERRATIA MARCESCENS: NOT DETECTED
STAPHYLOCOCCUS AUREUS BCID: NOT DETECTED
STAPHYLOCOCCUS SPECIES: DETECTED — AB
STREPTOCOCCUS PNEUMONIAE: NOT DETECTED
STREPTOCOCCUS PYOGENES: NOT DETECTED
Streptococcus agalactiae: NOT DETECTED
Streptococcus species: NOT DETECTED
VANCOMYCIN RESISTANCE: NOT DETECTED

## 2016-02-06 LAB — CBC
HEMATOCRIT: 26.4 % — AB (ref 35.0–47.0)
Hemoglobin: 8.6 g/dL — ABNORMAL LOW (ref 12.0–16.0)
MCH: 25.9 pg — ABNORMAL LOW (ref 26.0–34.0)
MCHC: 32.6 g/dL (ref 32.0–36.0)
MCV: 79.3 fL — AB (ref 80.0–100.0)
Platelets: 411 10*3/uL (ref 150–440)
RBC: 3.32 MIL/uL — ABNORMAL LOW (ref 3.80–5.20)
RDW: 15 % — AB (ref 11.5–14.5)
WBC: 12.4 10*3/uL — AB (ref 3.6–11.0)

## 2016-02-06 LAB — COMPREHENSIVE METABOLIC PANEL
ALBUMIN: 2.1 g/dL — AB (ref 3.5–5.0)
ALT: 42 U/L (ref 14–54)
AST: 52 U/L — AB (ref 15–41)
Alkaline Phosphatase: 105 U/L (ref 38–126)
Anion gap: 8 (ref 5–15)
BUN: 25 mg/dL — AB (ref 6–20)
CHLORIDE: 115 mmol/L — AB (ref 101–111)
CO2: 20 mmol/L — ABNORMAL LOW (ref 22–32)
Calcium: 8.4 mg/dL — ABNORMAL LOW (ref 8.9–10.3)
Creatinine, Ser: 1.02 mg/dL — ABNORMAL HIGH (ref 0.44–1.00)
GFR calc Af Amer: >60 mL/min (ref >60)
GLUCOSE: 116 mg/dL — AB (ref 65–99)
POTASSIUM: 3.7 mmol/L (ref 3.5–5.1)
Sodium: 143 mmol/L (ref 135–145)
Total Bilirubin: 0.1 mg/dL — ABNORMAL LOW (ref 0.3–1.2)
Total Protein: 5.6 g/dL — ABNORMAL LOW (ref 6.5–8.1)

## 2016-02-06 LAB — MAGNESIUM: Magnesium: 2.2 mg/dL (ref 1.7–2.4)

## 2016-02-06 MED ORDER — DEXTROSE 5 % IV SOLN
1.0000 g | Freq: Two times a day (BID) | INTRAVENOUS | Status: DC
  Filled 2016-02-06: qty 1

## 2016-02-06 MED ORDER — ACYCLOVIR 5 % EX OINT
TOPICAL_OINTMENT | CUTANEOUS | Status: DC
  Administered 2016-02-06 – 2016-02-08 (×11): via TOPICAL
  Filled 2016-02-06: qty 15

## 2016-02-06 MED ORDER — LEVOFLOXACIN IN D5W 250 MG/50ML IV SOLN
250.0000 mg | INTRAVENOUS | Status: DC
  Administered 2016-02-06 – 2016-02-07 (×2): 250 mg via INTRAVENOUS
  Filled 2016-02-06 (×4): qty 50

## 2016-02-06 MED ORDER — ALPRAZOLAM 0.5 MG PO TABS
0.5000 mg | ORAL_TABLET | Freq: Once | ORAL | Status: AC
  Administered 2016-02-06: 0.5 mg via ORAL
  Filled 2016-02-06: qty 1

## 2016-02-06 NOTE — Progress Notes (Signed)
PHARMACY - PHYSICIAN COMMUNICATION CRITICAL VALUE ALERT - BLOOD CULTURE IDENTIFICATION (BCID)  Results for orders placed or performed during the hospital encounter of 02/21/2016  Blood Culture ID Panel (Reflexed) (Collected: 02/14/2016  5:30 PM)  Result Value Ref Range   Enterococcus species NOT DETECTED NOT DETECTED   Vancomycin resistance NOT DETECTED NOT DETECTED   Listeria monocytogenes NOT DETECTED NOT DETECTED   Staphylococcus species DETECTED (A) NOT DETECTED   Staphylococcus aureus NOT DETECTED NOT DETECTED   Methicillin resistance NOT DETECTED NOT DETECTED   Streptococcus species NOT DETECTED NOT DETECTED   Streptococcus agalactiae NOT DETECTED NOT DETECTED   Streptococcus pneumoniae NOT DETECTED NOT DETECTED   Streptococcus pyogenes NOT DETECTED NOT DETECTED   Acinetobacter baumannii NOT DETECTED NOT DETECTED   Enterobacteriaceae species NOT DETECTED NOT DETECTED   Enterobacter cloacae complex NOT DETECTED NOT DETECTED   Escherichia coli NOT DETECTED NOT DETECTED   Klebsiella oxytoca NOT DETECTED NOT DETECTED   Klebsiella pneumoniae NOT DETECTED NOT DETECTED   Proteus species NOT DETECTED NOT DETECTED   Serratia marcescens NOT DETECTED NOT DETECTED   Carbapenem resistance NOT DETECTED NOT DETECTED   Haemophilus influenzae NOT DETECTED NOT DETECTED   Neisseria meningitidis NOT DETECTED NOT DETECTED   Pseudomonas aeruginosa NOT DETECTED NOT DETECTED   Candida albicans NOT DETECTED NOT DETECTED   Candida glabrata NOT DETECTED NOT DETECTED   Candida krusei NOT DETECTED NOT DETECTED   Candida parapsilosis NOT DETECTED NOT DETECTED   Candida tropicalis NOT DETECTED NOT DETECTED    Name of physician (or Provider) Contacted: Dr. Bridgett Larsson  Changes to prescribed antibiotics required: no changes to current therapy  Paulina Fusi, PharmD, BCPS 02/06/2016 5:47 PM

## 2016-02-06 NOTE — Progress Notes (Signed)
PT Cancellation Note  Patient Details Name: Leah Wyatt MRN: [id number] DOB: [birth year]   Cancelled Treatment:    Reason Eval/Treat Not Completed: Pain limiting ability to participate (Eval attempted; subjective information collected from family in room. Pt with 0/10 pain at rest but 10/10 pain with attempted OOB.  Results from R humeral CT pending, and ortho consult still pending. ) Discussed with RN. Pt will need additional pain control measures prior to attempted OOB. Will attempt again at later date/time.   2:57 PM, 02/06/2016 Etta Grandchild, PT, DPT PRN Physical Therapist - [place] License # [id number] [phone] (mobile)

## 2016-02-06 NOTE — Consult Note (Signed)
ORTHOPAEDIC CONSULTATION  REQUESTING PHYSICIAN: Demetrios Loll, MD  Chief Complaint: Right shoulder pain status post fall  HPI: Leah Wyatt is a 55 y.o. female who complains of  right shoulder pain. Patient explains that she has had 2 falls over the past 2 weeks. She states she fell 2 weeks ago and believes that this may be when she injured her right shoulder, but at that time did not have pain or visible bruising or swelling. She is reported to a fall and out of bed just prior to this admission and was complaining of right shoulder pain in the emergency room. X-rays were taken demonstrating a right proximal humerus fracture. Patient has a history of a brain aneurysm treated in 1998. She also has a history of COPD. Patient denies other areas of pain today in her hospital room during my interview. She complained of right hip pain upon arrival to the hospital. X-rays of her femur and pelvis were taken as part of her workup.  These films demonstrated a fragmented superior rami of the pelvis. The radiologist reported this appeared to be a lytic lesion.  I had ordered a CT scan of the right shoulder today to further characterize the fracture. On this CT scan the patient was found to have some endotracheal lymphadenopathy with a possible mediastinal mass.  Past Medical History  Diagnosis Date  . Brain aneurysm   . COPD (chronic obstructive pulmonary disease) [facility])    Past Surgical History  Procedure Laterality Date  . Brain surgery     Social History   Social History  . Marital Status: Single    Spouse Name: N/A  . Number of Children: N/A  . Years of Education: N/A   Social History Main Topics  . Smoking status: Current Every Day Smoker -- 1.50 packs/day for 20 years    Types: Cigarettes  . Smokeless tobacco: None  . Alcohol Use: No  . Drug Use: No  . Sexual Activity: Not Currently   Other Topics Concern  . None   Social History Narrative  . None   Family History  Problem  Relation Age of Onset  . Family history unknown: Yes   Allergies  Allergen Reactions  . Penicillins Hives and Swelling    Has patient had a PCN reaction causing immediate rash, facial/tongue/throat swelling, SOB or lightheadedness with hypotension: Yes Has patient had a PCN reaction causing severe rash involving mucus membranes or skin necrosis: No Has patient had a PCN reaction that required hospitalization No Has patient had a PCN reaction occurring within the last 10 years: not sure :If all of the above answers are "NO", then may proceed with Cephalosporin use.  Marland Kitchen Amoxicillin Rash   Prior to Admission medications   Medication Sig Start Date End Date Taking? Authorizing Provider  acetaminophen (TYLENOL) 500 MG tablet Take 500 mg by mouth every 6 (six) hours as needed for mild pain or moderate pain.   Yes Historical Provider, MD  aspirin EC 81 MG tablet Take 81 mg by mouth at bedtime. *Take with food.*   Yes Historical Provider, MD  calcium carbonate (TUMS) 500 MG chewable tablet Chew 500 mg by mouth 3 (three) times daily as needed for indigestion or heartburn.   Yes Historical Provider, MD  ibuprofen (ADVIL,MOTRIN) 600 MG tablet Take 600 mg by mouth See admin instructions. Take 1 tablet by mouth every 6 to 8 hours as needed for pain. *Take with food*   Yes Historical Provider, MD  loperamide (IMODIUM) 2  MG capsule Take 2 mg by mouth 2 (two) times daily as needed for diarrhea or loose stools.    Yes Historical Provider, MD  Polyethyl Glycol-Propyl Glycol (SYSTANE) 0.4-0.3 % SOLN Apply 2 drops to eye every 8 (eight) hours as needed. For dry eyes.   Yes Historical Provider, MD  predniSONE (DELTASONE) 10 MG tablet Take 10-60 mg by mouth See admin instructions. *Take 6 tablets (60 mg) by mouth on day 1, then 5 tablets (50 mg) by mouth on day 2, then 4 tablets (40 mg) by mouth on day 3, then 3 tablets (30 mg) by mouth on day 4, then take 2 tablets (20 mg) by mouth on day 5, then take 1 tablet by  mouth on day 6, then stop.*   Yes Historical Provider, MD  sertraline (ZOLOFT) 100 MG tablet Take 100 mg by mouth at bedtime.   Yes Historical Provider, MD  Vitamin D, Ergocalciferol, (DRISDOL) 50000 units CAPS capsule Take 50,000 Units by mouth See admin instructions. *Take 50,000 units by mouth on the first Monday of the month at bedtime.*   Yes Historical Provider, MD   Dg Skull 1-3 Views  02/20/2016  CLINICAL DATA:  Assess for myeloma.  Initial encounter. EXAM: SKULL - 1-3 VIEW COMPARISON:  CT of the head performed earlier today at 3:46 p.m. FINDINGS: Evaluation for myeloma is much more optimal on the recent CT of the head. No focal lytic lesions are seen to suggest myeloma. Multiple postoperative defects are noted along the frontal and parietal calvarium, with associated ventriculoperitoneal shunt, which appears grossly intact on provided images. The bony orbits are grossly unremarkable. The visualized paranasal sinuses and mastoid air cells are well-aerated. IMPRESSION: No focal lytic lesions seen to suggest myeloma. Multiple postoperative defects along the frontal and parietal calvarium, with associated ventriculoperitoneal shunt, which appears grossly intact. Evaluation for myeloma is much more optimal on recent CT of the head. Electronically Signed   By: Garald Balding M.D.   On: 01/31/2016 18:31   Dg Chest 1 View  02/18/2016  CLINICAL DATA:  Fall from bed last night, right humeral pain. VP shunt. Lytic destructive lesion in the right superior pubic ramus seen on radiographs today. EXAM: CHEST 1 VIEW COMPARISON:  None. FINDINGS: VP shunt tubing intact were visualized. There is a metal density in the right neck projecting over the shunt on the frontal projection. Linear subsegmental atelectasis or scarring in the lingula. Slight lobularity at the right cardiophrenic junction, probably incidental. Low lung volumes. The patient is rotated to the right on today's radiograph, reducing diagnostic  sensitivity and specificity. I do not appreciate a thoracic bony abnormality. 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 IMPRESSION: 1. Displaced right humeral surgical neck fracture, much better seen on the dedicated humeral radiographs. 2. VP shunt tubing intact over the chest. 3. Linear subsegmental atelectasis in the lingula. 4. Slight lobularity along the right cardiophrenic junction, probably from epicardial adipose tissue or a small pericardial cyst. In light of the heightened concern for malignancy based on the lytic lesion in the right superior pubic ramus, this might be further characterized with CT, if clinically warranted. Electronically Signed   By: Van Clines M.D.   On: 02/01/2016 17:10   Dg Pelvis 1-2 Views  02/03/2016  CLINICAL DATA:  Fall on bed last night.  Pain in the right femur. EXAM: PELVIS - 1-2 VIEW COMPARISON:  02/22/2016 radiograph FINDINGS: Destructive lytic lesion of  the right superior pubic ramus. Possible bony fragmentation. The right inferior pubic ramus spurs intact. I do not see any other definite destructive pelvic lesions although there is some asymmetry in density along the iliac bones which might be attributable soft tissues. Coil tubing in the pelvis from VP shunt. IMPRESSION: 1. Lytic destructive bony lesion in the right superior pubic ramus. Possible underlying pathologic fracture, with very little bony mineralization in this region and also bony expansion. Appearance highly concerning for malignancy such as myeloma or bony metastatic disease. I doubt that this is a bland fracture. Workup for myeloma or other source of malignancy recommended. These results will be called to the ordering clinician or representative by the Radiologist Assistant, and communication documented in the PACS or zVision Dashboard. Electronically Signed   By: Van Clines M.D.   On: 02/24/2016 17:06   Ct  Head Wo Contrast  02/24/2016  CLINICAL DATA:  Right-sided weakness and slurred speech. History of aneurysm repair and shunt placement. EXAM: CT HEAD WITHOUT CONTRAST TECHNIQUE: Contiguous axial images were obtained from the base of the skull through the vertex without intravenous contrast. COMPARISON:  11/18/2008 FINDINGS: Sinuses/Soft tissues: Left frontal craniotomy for aneurysm repair. Clear paranasal sinuses and mastoid air cells. Intracranial: Cerebral atrophy. Moderate low density in the periventricular white matter likely related to small vessel disease. More focal encephalomalacia involving the medial frontal lobes. This is felt to be similar to on the most recent exams. Right-sided VP shunt catheter terminates the left lateral ventricle, without hydronephrosis. No cortically based infarct, hydrocephalus, hemorrhage, or mass lesion identified. IMPRESSION: 1.  No acute intracranial abnormality. 2. 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 Surrounding encephalomalacia. 3. Right-sided VP shunt catheter in place without hydrocephalus. 4. Small vessel ischemic change. Report called to Dr. Jacqualine Code at 3:59 p.m. Electronically Signed   By: Abigail Miyamoto M.D.   On: 02/18/2016 16:02   Ct Shoulder Right Wo Contrast  02/06/2016  CLINICAL DATA:  Right humeral pain, fall from bed about 2 days ago. Fracture. EXAM: CT OF THE RIGHT SHOULDER WITHOUT CONTRAST TECHNIQUE: Multidetector CT imaging was performed according to the standard protocol. Multiplanar CT image reconstructions were also generated. COMPARISON:  Humerus radiographs from 01/27/2016 FINDINGS: There is a 2 part surgical neck fracture of the left proximal humerus with abduction of the humeral head component with respect to the shaft component, and with nondisplaced fractures in the greater tuberosity but not a displaced fracture. That said, there is considerable comminution along the dominant fracture plane,  with multiple additional bony fragments some of which are imbedded along the fracture plane, and multiple external bony fragments. The largest fragment separate from the head in shaft components is a 3.6 cm fragment on image 48/4, which contains cortical bone and which partially extends along the fracture plane. I do not observe a clavicular or scapular fracture. No left upper rib fracture is seen. Degenerative sternoclavicular arthropathy noted on the right and there is degenerative spurring of the right AC joint. Extensive edema tracks in the subcutaneous tissues the right shoulder. There is also edema tracking superficial to the right pectoralis musculature. Several fragments are present adjacent to the distal right pectoralis tendon which otherwise seems to attach to the shaft fragment. A right lower paratracheal lymph node measures 1.7 cm in short axis on image 56/9. A separate right lower paratracheal node measures 1.1 cm in short axis on image 48/9. I suspect a subcarinal mass measuring 3.4 cm on image 84/9,  although conceivably some type of hiatal hernia might simulated mass in this location. Emphysema noted in the right upper lung. Aortic atherosclerotic calcification is present. Rim calcified 2 cm in long axis left thyroid nodule with other thyroid nodules observed. IMPRESSION: 1. From a technical standpoint the right proximal humeral surgical neck fracture qualifies as a 2 part fracture, as the fracture plane extending through the greater tuberosity is not displaced but the surgical neck fracture itself is very displaced. However, the surgical neck fracture is considerably comminuted, with multiple fragments projecting along the fracture plane which might impede healing in a conservative management scenario. I do not see a definite underlying bony lesion to suggest that this fracture was pathologic in nature (I am aware of the patient's destructive right superior pubic ramus lesion). 2. Extensive  subcutaneous edema along the right shoulder and along the pectoralis musculature. Some of this edema tracks along the right axillary neurovascular structures. 3. There is abnormal pathologic adenopathy in the mediastinum. Possible subcarinal mass. This could be a manifestation of lymphoma, lung cancer, or other malignancy. It may well be related to the lytic lesion in the right superior pubic ramus. I recommend dedicated chest CT (with contrast if feasible) for further workup, and inclusion of the abdomen and pelvis probably be a good idea. 4. Emphysema. 5. Right-sided thyroid nodules. Electronically Signed   By: Van Clines M.D.   On: 02/06/2016 15:00   Dg Humerus Right  02/19/2016  CLINICAL DATA:  Pt fell out of bed last night and is now complaining of right humeral pain and pain in right femur. EXAM: RIGHT HUMERUS - 2+ VIEW COMPARISON:  None. FINDINGS: Comminuted fracture surgical neck of the humerus. Three dominant fracture fragments. Fracture fragments show mild to moderate displacement. IMPRESSION: Fracture humerus Electronically Signed   By: Skipper Cliche M.D.   On: 02/07/2016 17:01   Dg Femur, Min 2 Views Right  02/24/2016  CLINICAL DATA:  [place] out of bat last night, right femur pain EXAM: RIGHT FEMUR 2 VIEWS COMPARISON:  None. FINDINGS: Four views of the right femur submitted. No femur fracture or subluxation. There is cortical irregularity with lytic appearance right superior pubic ramus. Pathologic fracture or lytic bone lesion cannot be excluded. Clinical correlation is necessary. Further correlation with MRI or CT scan could be performed. IMPRESSION: No femur fracture or subluxation. There is cortical irregularity with lytic appearance right superior pubic ramus. Pathologic fracture or lytic bone lesion cannot be excluded. Clinical correlation is necessary. Further correlation with MRI or CT scan could be performed. Electronically Signed   By: Lahoma Crocker M.D.   On: 02/01/2016 17:06     Positive ROS: All other systems have been reviewed and were otherwise negative with the exception of those mentioned in the HPI and as above.  Physical Exam: General: Alert, no acute distress  MUSCULOSKELETAL: Right upper extremity:  Patient with intact skin overlying the right shoulder. There is positive swelling but no ecchymosis. She has intact sensation to light touch throughout the right upper extremity. She has full digital and wrist range of motion. She has palpable radial pulse. Fingers are well-perfused. She is currently in a right shoulder immobilizer.  Assessment: 1.  Two-part proximal humerus fracture mild to moderate displacement 2.  Mediastinal lymphadenopathy possible mass  Plan: I reviewed the patient's plain x-rays and CT scan of the right shoulder. I discussed with the patient her proximal humerus fracture. We discussed treatment options including nonoperative versus operative treatment. I recommend for now  she continue with her shoulder immobilizer. She may follow up with me in approximately a week for repeat x-rays of the right shoulder. Once the swelling is down the patient may be a candidate for surgical intervention if she chooses. Patient will require referral to a shoulder reconstruction specialist for any surgery for this fracture.  I will help the patient with this referral.   In regards to her pelvic lytic lesion in the mediastinal lymphadenopathy, I have ordered an oncology consult for evaluation. I'm going to defer to the oncology service for appropriate work-up of the mediastinal mass/lymphadenopathy and pelvic lytic lesion.  If biopsy of pelvis is required, patient will need to be transferred to [facility] or [facility] orthopaedic oncology services for this procedure.  Pelvic biopsies are not performed by any of the orthopaedic surgeons here at [facility].    Thornton Park, MD    02/06/2016 5:27 PM

## 2016-02-06 NOTE — Progress Notes (Signed)
[facility] NAME: Leah Wyatt    MR#:  [id number]  DATE OF BIRTH:  [birth year]  SUBJECTIVE:  CHIEF COMPLAINT:   Chief Complaint  Patient presents with  . Code Stroke   weakness REVIEW OF SYSTEMS:  CONSTITUTIONAL: No fever, has generalized weakness.  EYES: No blurred or double vision.  EARS, NOSE, AND THROAT: No tinnitus or ear pain.  RESPIRATORY: No cough, shortness of breath, wheezing or hemoptysis.  CARDIOVASCULAR: No chest pain, orthopnea, edema.  GASTROINTESTINAL: No nausea, vomiting, diarrhea or abdominal pain.  GENITOURINARY: No dysuria, hematuria.  ENDOCRINE: No polyuria, nocturia,  HEMATOLOGY: No anemia, easy bruising or bleeding SKIN: No rash or lesion. MUSCULOSKELETAL: No joint pain or arthritis.   NEUROLOGIC: No tingling, numbness, weakness.  PSYCHIATRY: No anxiety or depression.   DRUG ALLERGIES:   Allergies  Allergen Reactions  . Penicillins Hives and Swelling    Has patient had a PCN reaction causing immediate rash, facial/tongue/throat swelling, SOB or lightheadedness with hypotension: Yes Has patient had a PCN reaction causing severe rash involving mucus membranes or skin necrosis: No Has patient had a PCN reaction that required hospitalization No Has patient had a PCN reaction occurring within the last 10 years: not sure :If all of the above answers are "NO", then may proceed with Cephalosporin use.  Marland Kitchen Amoxicillin Rash    VITALS:  Blood pressure 132/80, pulse 76, temperature 97.4 F (36.3 C), temperature source Oral, resp. rate 16, height 5\' 4"  (1.626 m), weight 145 lb 3 oz (65.857 kg), SpO2 100 %.  PHYSICAL EXAMINATION:  GENERAL:  55 y.o.-year-old patient lying in the bed with no acute distress.  EYES: Pupils equal, round, reactive to light and accommodation. No scleral icterus. Extraocular muscles intact.  HEENT: Head atraumatic, normocephalic. Oropharynx and nasopharynx clear.  NECK:   Supple, no jugular venous distention. No thyroid enlargement, no tenderness.  LUNGS: Normal breath sounds bilaterally, no wheezing, rales,rhonchi or crepitation. No use of accessory muscles of respiration.  CARDIOVASCULAR: S1, S2 normal. No murmurs, rubs, or gallops.  ABDOMEN: Soft, nontender, nondistended. Bowel sounds present. No organomegaly or mass.  EXTREMITIES: No pedal edema, cyanosis, or clubbing.  NEUROLOGIC: Cranial nerves II through XII are intact. Muscle strength 5/5 in all extremities. Sensation intact. Gait not checked.  PSYCHIATRIC: The patient is alert and oriented x 3.  SKIN: No obvious rash, lesion, or ulcer.    LABORATORY PANEL:   CBC  Recent Labs Lab 02/06/16 0443  WBC 12.4*  HGB 8.6*  HCT 26.4*  PLT 411   ------------------------------------------------------------------------------------------------------------------  Chemistries   Recent Labs Lab 02/06/16 0443  NA 143  K 3.7  CL 115*  CO2 20*  GLUCOSE 116*  BUN 25*  CREATININE 1.02*  CALCIUM 8.4*  MG 2.2  AST 52*  ALT 42  ALKPHOS 105  BILITOT 0.1*   ------------------------------------------------------------------------------------------------------------------  Cardiac Enzymes  Recent Labs Lab 02/06/2016 1619  TROPONINI 0.04*   ------------------------------------------------------------------------------------------------------------------  RADIOLOGY:  Dg Skull 1-3 Views  02/24/2016  CLINICAL DATA:  Assess for myeloma.  Initial encounter. EXAM: SKULL - 1-3 VIEW COMPARISON:  CT of the head performed earlier today at 3:46 p.m. FINDINGS: Evaluation for myeloma is much more optimal on the recent CT of the head. No focal lytic lesions are seen to suggest myeloma. Multiple postoperative defects are noted along the frontal and parietal calvarium, with associated ventriculoperitoneal shunt, which appears grossly intact on provided images. The bony orbits are grossly unremarkable. The visualized  paranasal sinuses and mastoid air cells are well-aerated. IMPRESSION: No focal lytic lesions seen to suggest myeloma. Multiple postoperative defects along the frontal and parietal calvarium, with associated ventriculoperitoneal shunt, which appears grossly intact. Evaluation for myeloma is much more optimal on recent CT of the head. Electronically Signed   By: Garald Balding M.D.   On: 02/07/2016 18:31   Dg Chest 1 View  02/12/2016  CLINICAL DATA:  Fall from bed last night, right humeral pain. VP shunt. Lytic destructive lesion in the right superior pubic ramus seen on radiographs today. EXAM: CHEST 1 VIEW COMPARISON:  None. FINDINGS: VP shunt tubing intact were visualized. There is a metal density in the right neck projecting over the shunt on the frontal projection. Linear subsegmental atelectasis or scarring in the lingula. Slight lobularity at the right cardiophrenic junction, probably incidental. Low lung volumes. The patient is rotated to the right on today's radiograph, reducing diagnostic sensitivity and specificity. I do not appreciate a thoracic bony abnormality. 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 IMPRESSION: 1. Displaced right humeral surgical neck fracture, much better seen on the dedicated humeral radiographs. 2. VP shunt tubing intact over the chest. 3. Linear subsegmental atelectasis in the lingula. 4. Slight lobularity along the right cardiophrenic junction, probably from epicardial adipose tissue or a small pericardial cyst. In light of the heightened concern for malignancy based on the lytic lesion in the right superior pubic ramus, this might be further characterized with CT, if clinically warranted. Electronically Signed   By: Van Clines M.D.   On: 02/17/2016 17:10   Dg Pelvis 1-2 Views  02/16/2016  CLINICAL DATA:  Fall on bed last night.  Pain in the right femur. EXAM: PELVIS - 1-2 VIEW  COMPARISON:  02/07/2016 radiograph FINDINGS: Destructive lytic lesion of the right superior pubic ramus. Possible bony fragmentation. The right inferior pubic ramus spurs intact. I do not see any other definite destructive pelvic lesions although there is some asymmetry in density along the iliac bones which might be attributable soft tissues. Coil tubing in the pelvis from VP shunt. IMPRESSION: 1. Lytic destructive bony lesion in the right superior pubic ramus. Possible underlying pathologic fracture, with very little bony mineralization in this region and also bony expansion. Appearance highly concerning for malignancy such as myeloma or bony metastatic disease. I doubt that this is a bland fracture. Workup for myeloma or other source of malignancy recommended. These results will be called to the ordering clinician or representative by the Radiologist Assistant, and communication documented in the PACS or zVision Dashboard. Electronically Signed   By: Van Clines M.D.   On: 02/20/2016 17:06   Ct Head Wo Contrast  02/07/2016  CLINICAL DATA:  Right-sided weakness and slurred speech. History of aneurysm repair and shunt placement. EXAM: CT HEAD WITHOUT CONTRAST TECHNIQUE: Contiguous axial images were obtained from the base of the skull through the vertex without intravenous contrast. COMPARISON:  11/18/2008 FINDINGS: Sinuses/Soft tissues: Left frontal craniotomy for aneurysm repair. Clear paranasal sinuses and mastoid air cells. Intracranial: Cerebral atrophy. Moderate low density in the periventricular white matter likely related to small vessel disease. More focal encephalomalacia involving the medial frontal lobes. This is felt to be similar to on the most recent exams. Right-sided VP shunt catheter terminates the left lateral ventricle, without hydronephrosis. No cortically based infarct, hydrocephalus, hemorrhage, or mass lesion identified. IMPRESSION: 1.  No acute intracranial abnormality. 2. 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 Surrounding encephalomalacia. 3. Right-sided VP shunt catheter in place without hydrocephalus. 4. Small vessel ischemic change. Report called to Dr. Jacqualine Code at 3:59 p.m. Electronically Signed   By: Abigail Miyamoto M.D.   On: 02/16/2016 16:02   Dg Humerus Right  02/14/2016  CLINICAL DATA:  Pt fell out of bed last night and is now complaining of right humeral pain and pain in right femur. EXAM: RIGHT HUMERUS - 2+ VIEW COMPARISON:  None. FINDINGS: Comminuted fracture surgical neck of the humerus. Three dominant fracture fragments. Fracture fragments show mild to moderate displacement. IMPRESSION: Fracture humerus Electronically Signed   By: Skipper Cliche M.D.   On: 02/14/2016 17:01   Dg Femur, Min 2 Views Right  02/21/2016  CLINICAL DATA:  [place] out of bat last night, right femur pain EXAM: RIGHT FEMUR 2 VIEWS COMPARISON:  None. FINDINGS: Four views of the right femur submitted. No femur fracture or subluxation. There is cortical irregularity with lytic appearance right superior pubic ramus. Pathologic fracture or lytic bone lesion cannot be excluded. Clinical correlation is necessary. Further correlation with MRI or CT scan could be performed. IMPRESSION: No femur fracture or subluxation. There is cortical irregularity with lytic appearance right superior pubic ramus. Pathologic fracture or lytic bone lesion cannot be excluded. Clinical correlation is necessary. Further correlation with MRI or CT scan could be performed. Electronically Signed   By: Lahoma Crocker M.D.   On: 02/19/2016 17:06    EKG:   Orders placed or performed during the hospital encounter of 01/30/2016  . ED EKG  . ED EKG  . EKG 12-Lead  . EKG 12-Lead  . ED EKG 12-Lead  . ED EKG 12-Lead    ASSESSMENT AND PLAN:   Sepsis and UTI (urinary tract infection) Continue levaquin, discontinue azatam, f/u cultures.  Right humeral fracture. F/u ortho  surgeon.  COPD. Stable. NEB prn, spiriva.   All the records are reviewed and case discussed with Care Management/Social Workerr. Management plans discussed with the patient, family and they are in agreement.  CODE STATUS: full code.  TOTAL TIME TAKING CARE OF THIS PATIENT: 38 minutes.  Greater than 50% time was spent on coordination of care and face-to-face counseling.  POSSIBLE D/C IN 3 DAYS, DEPENDING ON CLINICAL CONDITION.   Demetrios Loll M.D on 02/06/2016 at 2:31 PM  Between 7am to 6pm - Pager - [phone]  After 6pm go to [email or web address] - password [email or web address] [facility]  Office  [phone]  CC: Primary care physician; No primary care provider on file.

## 2016-02-06 NOTE — Care Management Important Message (Signed)
Important Message  Patient Details  Name: Magic Capener MRN: [id number] Date of Birth: 1961/02/13   Medicare Important Message Given:  Yes    Shelbie Ammons, RN 02/06/2016, 9:42 AM

## 2016-02-06 NOTE — NC FL2 (Signed)
[place] LEVEL OF CARE SCREENING TOOL     IDENTIFICATION  Patient Name: Leah Wyatt Birthdate: 18-Mar-1961 Sex: female Admission Date (Current Location): 01/31/2016  [place] and [place] Number:  Engineering geologist and Address:  [facility], [address], [place], [place] [address]      Provider Number: [id number]  Attending Physician Name and Address:  Demetrios Loll, MD  Relative Name and Phone Number:       Current Level of Care: Hospital Recommended Level of Care: [place] Prior Approval Number:    Date Approved/Denied:   PASRR Number: [id number]  Discharge Plan: SNF    Current Diagnoses: Patient Active Problem List   Diagnosis Date Noted  . Sepsis ([place]) 01/31/2016  . UTI (urinary tract infection) 01/30/2016  . Right humeral fracture 02/17/2016  . COPD (chronic obstructive pulmonary disease) ([place]) 02/07/2016    Orientation RESPIRATION BLADDER Height & Weight     Self, Time, Situation, Place  Normal Incontinent Weight: 145 lb 3 oz (65.857 kg) Height:  5\' 4"  (162.6 cm)  BEHAVIORAL SYMPTOMS/MOOD NEUROLOGICAL BOWEL NUTRITION STATUS      Continent Diet (Regular Diet)  AMBULATORY STATUS COMMUNICATION OF NEEDS Skin   Limited Assist Verbally Normal                       Personal Care Assistance Level of Assistance  Bathing, Feeding, Dressing Bathing Assistance: Limited assistance Feeding assistance: Independent Dressing Assistance: Limited assistance     Functional Limitations Info  Sight, Hearing, Speech Sight Info: Adequate Hearing Info: Adequate Speech Info: Adequate    SPECIAL CARE FACTORS FREQUENCY  PT (By licensed PT)     PT Frequency: 5              Contractures      Additional Factors Info  Code Status, Allergies Code Status Info: Full Code Allergies Info: Penicillins, Amoxicillins           Current Medications (02/06/2016):  This is the current hospital active  medication list Current Facility-Administered Medications  Medication Dose Route Frequency Provider Last Rate Last Dose  . 0.9 % NaCl with KCl 40 mEq / L  infusion   Intravenous Continuous Idelle Crouch, MD 75 mL/hr at 02/06/16 1217 75 mL/hr at 02/06/16 1217  . acetaminophen (TYLENOL) tablet 650 mg  650 mg Oral Q6H PRN Idelle Crouch, MD       Or  . acetaminophen (TYLENOL) suppository 650 mg  650 mg Rectal Q6H PRN Idelle Crouch, MD      . aspirin EC tablet 81 mg  81 mg Oral Daily Idelle Crouch, MD   81 mg at 02/06/16 0957  . bisacodyl (DULCOLAX) suppository 10 mg  10 mg Rectal Daily PRN Idelle Crouch, MD      . docusate sodium (COLACE) capsule 100 mg  100 mg Oral BID Idelle Crouch, MD   100 mg at 02/06/16 0957  . heparin injection 5,000 Units  5,000 Units Subcutaneous Q8H Idelle Crouch, MD   5,000 Units at 02/06/16 0526  . HYDROcodone-acetaminophen (NORCO/VICODIN) 5-325 MG per tablet 1-2 tablet  1-2 tablet Oral Q4H PRN Idelle Crouch, MD   2 tablet at 02/06/16 1540  . ipratropium-albuterol (DUONEB) 0.5-2.5 (3) MG/3ML nebulizer solution 3 mL  3 mL Nebulization Q4H PRN Idelle Crouch, MD      . Levofloxacin (LEVAQUIN) IVPB 250 mg  250 mg Intravenous Q24H Darylene Price  [place], [place]      . loperamide (IMODIUM) capsule 2 mg  2 mg Oral BID PRN Idelle Crouch, MD      . mometasone-formoterol [facility]) 200-5 MCG/ACT inhaler 2 puff  2 puff Inhalation BID Idelle Crouch, MD   2 puff at 02/06/16 1004  . morphine 2 MG/ML injection 2 mg  2 mg Intravenous Q2H PRN Idelle Crouch, MD   2 mg at 02/06/16 0957  . ondansetron (ZOFRAN) tablet 4 mg  4 mg Oral Q6H PRN Idelle Crouch, MD       Or  . ondansetron [facility]) injection 4 mg  4 mg Intravenous Q6H PRN Idelle Crouch, MD      . pantoprazole (PROTONIX) EC tablet 40 mg  40 mg Oral Daily Idelle Crouch, MD   40 mg at 02/06/16 0957  . sertraline (ZOLOFT) tablet 100 mg  100 mg Oral QHS Idelle Crouch, MD   100 mg at 02/07/2016 2241  .  sodium chloride flush (NS) 0.9 % injection 3 mL  3 mL Intravenous Q12H Idelle Crouch, MD   3 mL at 02/06/16 1005  . tiotropium (SPIRIVA) inhalation capsule 18 mcg  18 mcg Inhalation Daily Idelle Crouch, MD   18 mcg at 02/06/16 0957     Discharge Medications: Please see discharge summary for a list of discharge medications.  Relevant Imaging Results:  Relevant Lab Results:   Additional Information SSN: [id number]  Darden Dates, LCSW

## 2016-02-06 NOTE — Progress Notes (Signed)
Pharmacy Antibiotic Note  Leah Wyatt is a 55 y.o. female admitted on 02/22/2016 with UTI.  Pharmacy has been consulted for levofloxacin and aztreonam dosing.  This is day #2 of antibiotics, currently prescribed levofloxacin and aztreonam.  Plan: Change levofloxacin dose to 250 mg IV q24h for UTI Change aztreonam to 1000 mg IV q12h  Height: 5\' 4"  (162.6 cm) Weight: 145 lb 3 oz (65.857 kg) IBW/kg (Calculated) : 54.7  Temp (24hrs), Avg:97.7 F (36.5 C), Min:97.4 F (36.3 C), Max:97.9 F (36.6 C)   Recent Labs Lab 01/27/2016 1619 02/12/2016 1712 02/07/2016 1956 02/06/16 0443  WBC 21.9*  --   --  12.4*  CREATININE 1.43*  --   --  1.02*  LATICACIDVEN  --  1.0 1.7  --     Estimated Creatinine Clearance: 57.6 mL/min (by C-G formula based on Cr of 1.02).    Allergies  Allergen Reactions  . Penicillins Hives and Swelling    Has patient had a PCN reaction causing immediate rash, facial/tongue/throat swelling, SOB or lightheadedness with hypotension: Yes Has patient had a PCN reaction causing severe rash involving mucus membranes or skin necrosis: No Has patient had a PCN reaction that required hospitalization No Has patient had a PCN reaction occurring within the last 10 years: not sure :If all of the above answers are "NO", then may proceed with Cephalosporin use.  Marland Kitchen Amoxicillin Rash   Antimicrobials this admission: aztreonam 6/11 >>  levofloxacin 6/11 >>   Dose adjustments this admission: 6/12 changed levofloxacin from 750 mg IV q48h to 250 mg IV q24h 6/12 Changed aztreonam from 1000 mg IV q8h to 1000 mg IV q12h  Microbiology results: 6/11 BCx: No growth to dayte 6/11 UCx: Sent   Thank you for allowing pharmacy to be a part of this patient's care.  Lenis Noon, PharmD Clinical Pharmacist 02/06/2016 11:18 AM

## 2016-02-06 NOTE — Progress Notes (Signed)
Per Dr. Bridgett Larsson d/c 24 hour urine collection as pt is incontinent.  Clarise Cruz, RN

## 2016-02-07 DIAGNOSIS — R590 Localized enlarged lymph nodes: Secondary | ICD-10-CM

## 2016-02-07 DIAGNOSIS — Z87820 Personal history of traumatic brain injury: Secondary | ICD-10-CM

## 2016-02-07 DIAGNOSIS — F17210 Nicotine dependence, cigarettes, uncomplicated: Secondary | ICD-10-CM

## 2016-02-07 DIAGNOSIS — M899 Disorder of bone, unspecified: Secondary | ICD-10-CM

## 2016-02-07 DIAGNOSIS — J449 Chronic obstructive pulmonary disease, unspecified: Secondary | ICD-10-CM

## 2016-02-07 LAB — PROTEIN ELECTROPHORESIS, SERUM
A/G RATIO SPE: 0.6 — AB (ref 0.7–1.7)
Albumin ELP: 2.4 g/dL — ABNORMAL LOW (ref 2.9–4.4)
Alpha-1-Globulin: 0.5 g/dL — ABNORMAL HIGH (ref 0.0–0.4)
Alpha-2-Globulin: 1.3 g/dL — ABNORMAL HIGH (ref 0.4–1.0)
BETA GLOBULIN: 1.2 g/dL (ref 0.7–1.3)
GLOBULIN, TOTAL: 3.8 g/dL (ref 2.2–3.9)
Gamma Globulin: 0.8 g/dL (ref 0.4–1.8)
TOTAL PROTEIN ELP: 6.2 g/dL (ref 6.0–8.5)

## 2016-02-07 LAB — LACTATE DEHYDROGENASE: LDH: 214 U/L — AB (ref 98–192)

## 2016-02-07 MED ORDER — VANCOMYCIN HCL IN DEXTROSE 1-5 GM/200ML-% IV SOLN
1000.0000 mg | Freq: Once | INTRAVENOUS | Status: AC
  Administered 2016-02-07: 1000 mg via INTRAVENOUS
  Filled 2016-02-07: qty 200

## 2016-02-07 MED ORDER — ENOXAPARIN SODIUM 40 MG/0.4ML ~~LOC~~ SOLN
40.0000 mg | SUBCUTANEOUS | Status: DC
  Administered 2016-02-07: 40 mg via SUBCUTANEOUS
  Filled 2016-02-07: qty 0.4

## 2016-02-07 MED ORDER — DIATRIZOATE MEGLUMINE & SODIUM 66-10 % PO SOLN
15.0000 mL | ORAL | Status: AC
  Administered 2016-02-07: 15 mL via ORAL

## 2016-02-07 MED ORDER — VANCOMYCIN HCL IN DEXTROSE 750-5 MG/150ML-% IV SOLN
750.0000 mg | Freq: Two times a day (BID) | INTRAVENOUS | Status: DC
  Administered 2016-02-08: 02:00:00 750 mg via INTRAVENOUS
  Filled 2016-02-07 (×3): qty 150

## 2016-02-07 NOTE — Progress Notes (Signed)
Pharmacy Antibiotic Note  Leah Wyatt is a 55 y.o. female admitted on 01/29/2016 with UTI.  Pharmacy has been consulted for levofloxacin and aztreonam dosing.  This is day #3 of antibiotics, currently prescribed levofloxacin; aztreonam d/c'd 6/12.  Plan: Continue levofloxacin 250 mg IV q24h for UTI   Height: 5\' 4"  (162.6 cm) Weight: 156 lb 8 oz (70.988 kg) IBW/kg (Calculated) : 54.7  Temp (24hrs), Avg:98.1 F (36.7 C), Min:97.7 F (36.5 C), Max:98.3 F (36.8 C)   Recent Labs Lab 02/17/2016 1619 02/23/2016 1712 02/20/2016 1956 02/06/16 0443  WBC 21.9*  --   --  12.4*  CREATININE 1.43*  --   --  1.02*  LATICACIDVEN  --  1.0 1.7  --     Estimated Creatinine Clearance: 59.5 mL/min (by C-G formula based on Cr of 1.02).    Allergies  Allergen Reactions  . Penicillins Hives and Swelling    Has patient had a PCN reaction causing immediate rash, facial/tongue/throat swelling, SOB or lightheadedness with hypotension: Yes Has patient had a PCN reaction causing severe rash involving mucus membranes or skin necrosis: No Has patient had a PCN reaction that required hospitalization No Has patient had a PCN reaction occurring within the last 10 years: not sure :If all of the above answers are "NO", then may proceed with Cephalosporin use.  Marland Kitchen Amoxicillin Rash   Antimicrobials this admission: aztreonam 6/11 >> 6/12 levofloxacin 6/11 >>   Dose adjustments this admission: 6/12 changed levofloxacin from 750 mg IV q48h to 250 mg IV q24h 6/12 Changed aztreonam from 1000 mg IV q8h to 1000 mg IV q12h  Microbiology results: 6/11 BCx: 1/2 GPC ([employer] states staph spp - MD called on 6/12 per note) 6/11 UCx: >100k GNR  Thank you for allowing pharmacy to be a part of this patient's care.  Rocky Morel, PharmD Clinical Pharmacist 02/07/2016 9:33 AM

## 2016-02-07 NOTE — Clinical Social Work Note (Signed)
Clinical Social Work Assessment  Patient Details  Name: Leah Wyatt MRN: [id number] Date of Birth: 1960/09/08  Date of referral:  02/07/16               Reason for consult:  Facility Placement                Permission sought to share information with:  Facility Sport and exercise psychologist, Family Supports, Case Optician, dispensing granted to share information::  Yes, Verbal Permission Granted  Name::        Agency::     Relationship::     Contact Information:     Housing/Transportation Living arrangements for the past 2 months:  Single Family Home Source of Information:  Patient Patient Interpreter Needed:  None Criminal Activity/Legal Involvement Pertinent to Current Situation/Hospitalization:  No - Comment as needed Significant Relationships:  Siblings, Parents Lives with:  Self Do you feel safe going back to the place where you live?  Yes Need for family participation in patient care:  No (Coment)  Care giving concerns:  Patient lives alone.   Social Worker assessment / plan:  CSW spoke with patient regarding PT recommendations for STR. Patient is a PACE recipient. Patient very vocal with CSW and very matter of fact. Patient very pleasant to speak with this afternoon. CSW and patient discussed recommendations for STR and patient stated she would be willing to go for STR but it would have to be short because she has to smoke. Patient stated she would prefer to return home. Patients admits that due to her medical issues, her only likes in the world are drinking Dr. Malachi Wyatt, doing crossword/word search puzzles, smoking, and eating. She states she knows that some of it is bad for her but she states she rather go out happy. Patient reports that her sister has made her frustrated lately and that she is worse when she is in front of friends. Patient reports her sister fusses at her constantly about her smoking and drinking Dr. Malachi Wyatt. At that point, patient's sister returned to  patient's room and patient stopped talking about her. CSW explained that I would reach out to [facility]ACE and see if they preferred STR or setting up services in the home. Patient verbalized understanding.   CSW spoke with Leah Wyatt, patient's PACE social worker, this afternoon and informed her of PT's recommendation for STR and patient's desires to continue smoking. They are very familiar with patient and Leah Wyatt stated she would discuss with the care team and call me back with their disposition. Shortly thereafter, Leah Wyatt, called CSW to inform that they would arrange services in patient's home and that at time of discharge we could simply discharge patient to return home and they would take care of the rest. CSW contacted Dr. Bridgett Larsson and he stated discharge is anticipated tomorrow. CSW contacted Leah Wyatt and informed her of this. Patient is very happy about the decision by [facility] to arrange services in the home.   Employment status:  Disabled (Comment on whether or not currently receiving Disability) Insurance information:  Managed Medicare PT Recommendations:  [place] / Referral to community resources:     Patient/Family's Response to care:  Patient expressed appreciation for CSW assistance.  Patient/Family's Understanding of and Emotional Response to Diagnosis, Current Treatment, and Prognosis:  Patient has insight into her medical conditions but states she is not willing to give up her vices because that is what brings her satisfaction.  Emotional Assessment Appearance:  Appears stated age Attitude/Demeanor/Rapport:   (  matter of fact; cooperative) Affect (typically observed):  Accepting, Frustrated Orientation:  Oriented to Self, Oriented to Place, Oriented to  Time, Oriented to Situation, Fluctuating Orientation (Suspected and/or reported Sundowners) Alcohol / Substance use:  Not Applicable Psych involvement (Current and /or in the community):  No (Comment)  Discharge Needs   Concerns to be addressed:  Care Coordination Readmission within the last 30 days:  No Current discharge risk:  None Barriers to Discharge:  No Barriers Identified   Shela Leff, LCSW 02/07/2016, 3:27 PM

## 2016-02-07 NOTE — Progress Notes (Addendum)
[facility] [place] NAME: Leah Wyatt    MR#:  [id number]  DATE OF BIRTH:  06-28-1961  SUBJECTIVE:  CHIEF COMPLAINT:   Chief Complaint  Patient presents with  . Code Stroke   weakness REVIEW OF SYSTEMS:  CONSTITUTIONAL: No fever, has generalized weakness.  EYES: No blurred or double vision.  EARS, NOSE, AND THROAT: No tinnitus or ear pain.  RESPIRATORY: No cough, shortness of breath, wheezing or hemoptysis.  CARDIOVASCULAR: No chest pain, orthopnea, edema.  GASTROINTESTINAL: No nausea, vomiting, diarrhea or abdominal pain.  GENITOURINARY: No dysuria, hematuria.  ENDOCRINE: No polyuria, nocturia,  HEMATOLOGY: No anemia, easy bruising or bleeding SKIN: No rash or lesion. MUSCULOSKELETAL: No joint pain or arthritis.   NEUROLOGIC: No tingling, numbness, weakness.  PSYCHIATRY: No anxiety or depression.   DRUG ALLERGIES:   Allergies  Allergen Reactions  . Penicillins Hives and Swelling    Has patient had a PCN reaction causing immediate rash, facial/tongue/throat swelling, SOB or lightheadedness with hypotension: Yes Has patient had a PCN reaction causing severe rash involving mucus membranes or skin necrosis: No Has patient had a PCN reaction that required hospitalization No Has patient had a PCN reaction occurring within the last 10 years: not sure :If all of the above answers are "NO", then may proceed with Cephalosporin use.  Marland Kitchen Amoxicillin Rash    VITALS:  Blood pressure 134/61, pulse 89, temperature 98.9 F (37.2 C), temperature source Oral, resp. rate 20, height 5\' 4"  (1.626 m), weight 156 lb 8 oz (70.988 kg), SpO2 97 %.  PHYSICAL EXAMINATION:  GENERAL:  55 y.o.-year-old patient lying in the bed with no acute distress.  EYES: Pupils equal, round, reactive to light and accommodation. No scleral icterus. Extraocular muscles intact.  HEENT: Head atraumatic, normocephalic. Oropharynx and nasopharynx clear.  NECK:   Supple, no jugular venous distention. No thyroid enlargement, no tenderness.  LUNGS: Normal breath sounds bilaterally, no wheezing, rales,rhonchi or crepitation. No use of accessory muscles of respiration.  CARDIOVASCULAR: S1, S2 normal. No murmurs, rubs, or gallops.  ABDOMEN: Soft, nontender, nondistended. Bowel sounds present. No organomegaly or mass.  EXTREMITIES: No pedal edema, cyanosis, or clubbing.  NEUROLOGIC: Cranial nerves II through XII are intact. Muscle strength 5/5 in all extremities. Sensation intact. Gait not checked.  PSYCHIATRIC: The patient is alert and oriented x 3.  SKIN: No obvious rash, lesion, or ulcer.    LABORATORY PANEL:   CBC  Recent Labs Lab 02/06/16 0443  WBC 12.4*  HGB 8.6*  HCT 26.4*  PLT 411   ------------------------------------------------------------------------------------------------------------------  Chemistries   Recent Labs Lab 02/06/16 0443  NA 143  K 3.7  CL 115*  CO2 20*  GLUCOSE 116*  BUN 25*  CREATININE 1.02*  CALCIUM 8.4*  MG 2.2  AST 52*  ALT 42  ALKPHOS 105  BILITOT 0.1*   ------------------------------------------------------------------------------------------------------------------  Cardiac Enzymes  Recent Labs Lab 01/27/2016 1619  TROPONINI 0.04*   ------------------------------------------------------------------------------------------------------------------  RADIOLOGY:  Dg Skull 1-3 Views  02/17/2016  CLINICAL DATA:  Assess for myeloma.  Initial encounter. EXAM: SKULL - 1-3 VIEW COMPARISON:  CT of the head performed earlier today at 3:46 p.m. FINDINGS: Evaluation for myeloma is much more optimal on the recent CT of the head. No focal lytic lesions are seen to suggest myeloma. Multiple postoperative defects are noted along the frontal and parietal calvarium, with associated ventriculoperitoneal shunt, which appears grossly intact on provided images. The bony orbits are grossly unremarkable. The visualized  paranasal sinuses and mastoid air cells are well-aerated. IMPRESSION: No focal lytic lesions seen to suggest myeloma. Multiple postoperative defects along the frontal and parietal calvarium, with associated ventriculoperitoneal shunt, which appears grossly intact. Evaluation for myeloma is much more optimal on recent CT of the head. Electronically Signed   By: Garald Balding M.D.   On: 02/20/2016 18:31   Dg Chest 1 View  02/15/2016  CLINICAL DATA:  Fall from bed last night, right humeral pain. VP shunt. Lytic destructive lesion in the right superior pubic ramus seen on radiographs today. EXAM: CHEST 1 VIEW COMPARISON:  None. FINDINGS: VP shunt tubing intact were visualized. There is a metal density in the right neck projecting over the shunt on the frontal projection. Linear subsegmental atelectasis or scarring in the lingula. Slight lobularity at the right cardiophrenic junction, probably incidental. Low lung volumes. The patient is rotated to the right on today's radiograph, reducing diagnostic sensitivity and specificity. I do not appreciate a thoracic bony abnormality. 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 IMPRESSION: 1. Displaced right humeral surgical neck fracture, much better seen on the dedicated humeral radiographs. 2. VP shunt tubing intact over the chest. 3. Linear subsegmental atelectasis in the lingula. 4. Slight lobularity along the right cardiophrenic junction, probably from epicardial adipose tissue or a small pericardial cyst. In light of the heightened concern for malignancy based on the lytic lesion in the right superior pubic ramus, this might be further characterized with CT, if clinically warranted. Electronically Signed   By: Van Clines M.D.   On: 02/24/2016 17:10   Dg Pelvis 1-2 Views  01/26/2016  CLINICAL DATA:  Fall on bed last night.  Pain in the right femur. EXAM: PELVIS - 1-2 VIEW  COMPARISON:  02/12/2016 radiograph FINDINGS: Destructive lytic lesion of the right superior pubic ramus. Possible bony fragmentation. The right inferior pubic ramus spurs intact. I do not see any other definite destructive pelvic lesions although there is some asymmetry in density along the iliac bones which might be attributable soft tissues. Coil tubing in the pelvis from VP shunt. IMPRESSION: 1. Lytic destructive bony lesion in the right superior pubic ramus. Possible underlying pathologic fracture, with very little bony mineralization in this region and also bony expansion. Appearance highly concerning for malignancy such as myeloma or bony metastatic disease. I doubt that this is a bland fracture. Workup for myeloma or other source of malignancy recommended. These results will be called to the ordering clinician or representative by the Radiologist Assistant, and communication documented in the PACS or zVision Dashboard. Electronically Signed   By: Van Clines M.D.   On: 02/18/2016 17:06   Ct Shoulder Right Wo Contrast  02/06/2016  CLINICAL DATA:  Right humeral pain, fall from bed about 2 days ago. Fracture. EXAM: CT OF THE RIGHT SHOULDER WITHOUT CONTRAST TECHNIQUE: Multidetector CT imaging was performed according to the standard protocol. Multiplanar CT image reconstructions were also generated. COMPARISON:  Humerus radiographs from 02/16/2016 FINDINGS: There is a 2 part surgical neck fracture of the left proximal humerus with abduction of the humeral head component with respect to the shaft component, and with nondisplaced fractures in the greater tuberosity but not a displaced fracture. That said, there is considerable comminution along the dominant fracture plane, with multiple additional bony fragments some of which are imbedded along the fracture plane, and multiple external bony fragments. The largest fragment separate from the head in shaft components  is a 3.6 cm fragment on image 48/4, which  contains cortical bone and which partially extends along the fracture plane. I do not observe a clavicular or scapular fracture. No left upper rib fracture is seen. Degenerative sternoclavicular arthropathy noted on the right and there is degenerative spurring of the right AC joint. Extensive edema tracks in the subcutaneous tissues the right shoulder. There is also edema tracking superficial to the right pectoralis musculature. Several fragments are present adjacent to the distal right pectoralis tendon which otherwise seems to attach to the shaft fragment. A right lower paratracheal lymph node measures 1.7 cm in short axis on image 56/9. A separate right lower paratracheal node measures 1.1 cm in short axis on image 48/9. I suspect a subcarinal mass measuring 3.4 cm on image 84/9, although conceivably some type of hiatal hernia might simulated mass in this location. Emphysema noted in the right upper lung. Aortic atherosclerotic calcification is present. Rim calcified 2 cm in long axis left thyroid nodule with other thyroid nodules observed. IMPRESSION: 1. From a technical standpoint the right proximal humeral surgical neck fracture qualifies as a 2 part fracture, as the fracture plane extending through the greater tuberosity is not displaced but the surgical neck fracture itself is very displaced. However, the surgical neck fracture is considerably comminuted, with multiple fragments projecting along the fracture plane which might impede healing in a conservative management scenario. I do not see a definite underlying bony lesion to suggest that this fracture was pathologic in nature (I am aware of the patient's destructive right superior pubic ramus lesion). 2. Extensive subcutaneous edema along the right shoulder and along the pectoralis musculature. Some of this edema tracks along the right axillary neurovascular structures. 3. There is abnormal pathologic adenopathy in the mediastinum. Possible subcarinal  mass. This could be a manifestation of lymphoma, lung cancer, or other malignancy. It may well be related to the lytic lesion in the right superior pubic ramus. I recommend dedicated chest CT (with contrast if feasible) for further workup, and inclusion of the abdomen and pelvis probably be a good idea. 4. Emphysema. 5. Right-sided thyroid nodules. Electronically Signed   By: Van Clines M.D.   On: 02/06/2016 15:00   Dg Chest Port 1 View  02/06/2016  CLINICAL DATA:  Status post PICC line placement EXAM: PORTABLE CHEST 1 VIEW COMPARISON:  02/11/2016 FINDINGS: Cardiac shadow is stable. A new left-sided PICC line is noted with catheter tip at the cavoatrial junction. Ventriculoperitoneal shunt catheter is again seen and stable. Fracture in the proximal right humerus is again identified. IMPRESSION: Status post PICC line placement in satisfactory PICC position Electronically Signed   By: Inez Catalina M.D.   On: 02/06/2016 20:45   Dg Humerus Right  02/18/2016  CLINICAL DATA:  Pt fell out of bed last night and is now complaining of right humeral pain and pain in right femur. EXAM: RIGHT HUMERUS - 2+ VIEW COMPARISON:  None. FINDINGS: Comminuted fracture surgical neck of the humerus. Three dominant fracture fragments. Fracture fragments show mild to moderate displacement. IMPRESSION: Fracture humerus Electronically Signed   By: Skipper Cliche M.D.   On: 02/09/2016 17:01   Dg Femur, Min 2 Views Right  02/06/2016  CLINICAL DATA:  [place] out of bat last night, right femur pain EXAM: RIGHT FEMUR 2 VIEWS COMPARISON:  None. FINDINGS: Four views of the right femur submitted. No femur fracture or subluxation. There is cortical irregularity with lytic appearance right superior pubic ramus. Pathologic fracture or lytic  bone lesion cannot be excluded. Clinical correlation is necessary. Further correlation with MRI or CT scan could be performed. IMPRESSION: No femur fracture or subluxation. There is cortical irregularity  with lytic appearance right superior pubic ramus. Pathologic fracture or lytic bone lesion cannot be excluded. Clinical correlation is necessary. Further correlation with MRI or CT scan could be performed. Electronically Signed   By: Lahoma Crocker M.D.   On: 02/21/2016 17:06    EKG:   Orders placed or performed during the hospital encounter of 02/04/2016  . ED EKG  . ED EKG  . EKG 12-Lead  . EKG 12-Lead  . ED EKG 12-Lead  . ED EKG 12-Lead    ASSESSMENT AND PLAN:   Sepsis and UTI (urinary tract infection), bacteremia. Continue levaquin, discontinued azatam, urin culture: GNR. B/C: GRAM POSITIVE COCCI  IN BOTH AEROBIC AND ANAEROBIC BOTTLES.  ID consult. Start vancomycin PTD. F/u CBC.  Right humeral fracture. Per Dr. Mack Guise, continue to use her right shoulder immobilizer.  ARF due to dehydration. Improved.  Hypokalemia. Improved.  COPD. Stable. NEB prn, spiriva.  Metastatic lymphadenopathy: Unclear etiology.  Per Dr. Grayland Ormond, will get a dedicated chest CT to further evaluate. We will also get an abdominal and pelvic CT for completeness. Ultimately, patient may require biopsy but this can be accomplished as an outpatient. Peripheral blood flow cytometry has been ordered and is pending.  We discussed Dr. Mack Guise. All the records are reviewed and case discussed with Care Management/Social Workerr. Management plans discussed with the patient, her sister and they are in agreement.  CODE STATUS: full code.  TOTAL TIME TAKING CARE OF THIS PATIENT: 38 minutes.  Greater than 50% time was spent on coordination of care and face-to-face counseling.  POSSIBLE D/C IN 2 DAYS, DEPENDING ON CLINICAL CONDITION.   Demetrios Loll M.D on 02/07/2016 at 4:21 PM  Between 7am to 6pm - Pager - [phone]  After 6pm go to [email or web address] - password [email or web address] [facility]  [facility]  Office  [phone]  CC: Primary care physician; No primary care provider on file.

## 2016-02-07 NOTE — Evaluation (Addendum)
Physical Therapy Evaluation Patient Details Name: Leah Wyatt MRN: [id number] DOB: February 02, 1961 Today's Date: 02/07/2016   History of Present Illness  Pt admitted for sepsis. Pt with history of brain aneurysm, COPD and previous CVA. Pt with now complaints of R arm weakness/pain. Pt with multiple falls recently. Prior to arrival, pt fell out of bed and now has R humeral fracture (greater tuberosity and surgical neck). Pt now in immobilizer and no surgical plans at this time per Ortho. Pt also with lytic lesion on R superior pubic ramus. Pt with recent PICC placement.  Clinical Impression  Pt is a pleasant 55 year old female who was admitted for falls with R humeral fracture and sepsis. Pt performs bed mobility with mod assist, however is unable to complete full movement secondary to pain. Pt premedicated prior to mobility per RN. Pt demonstrates deficits with strength/pain/mobility. Pt's breakfast came and preferred to eat breakfast, therefore exercises deferred. Would benefit from skilled PT to address above deficits and promote optimal return to PLOF. Pt really wants to go home, however options discussed for safe discharge and pt agreeable to think about going to SNF for more care and therapy; recommend transition to STR upon discharge from acute hospitalization.       Follow Up Recommendations SNF    Equipment Recommendations       Recommendations for Other Services       Precautions / Restrictions Precautions Precautions: Fall Required Braces or Orthoses: Other Brace/Splint (R UE immobilizer) Restrictions Weight Bearing Restrictions: Yes RUE Weight Bearing: Non weight bearing      Mobility  Bed Mobility Overal bed mobility: Needs Assistance Bed Mobility: Supine to Sit     Supine to sit: Mod assist;HOB elevated     General bed mobility comments: assist for sequencing and scooting out towards EOB. Partial way through mobility, pt yells out in pain and is unable to  finish movement. Pt refuses further mobility attempts at this time   Transfers                 General transfer comment: unable  Ambulation/Gait                Stairs            Wheelchair Mobility    Modified Rankin (Stroke Patients Only)       Balance                                             Pertinent Vitals/Pain Pain Assessment: Faces Faces Pain Scale: Hurts whole lot Pain Location: R UE with all movement Pain Descriptors / Indicators: Aching;Discomfort;Grimacing Pain Intervention(s): Limited activity within patient's tolerance;Premedicated before session    Home Living Family/patient expects to be discharged to:: Private residence Living Arrangements: Alone Available Help at Discharge: Family Type of Home: House Home Access: Stairs to enter Entrance Stairs-Rails: Left Entrance Stairs-Number of Steps: 5 Home Layout: One level Home Equipment: None      Prior Function Level of Independence: Independent               Hand Dominance        Extremity/Trunk Assessment   Upper Extremity Assessment: Generalized weakness (R UE grip/wrist strength 5/5; 3/5 elbow; shoulder not tested)           Lower Extremity Assessment: Overall WFL for tasks assessed  Communication   Communication: No difficulties  Cognition Arousal/Alertness: Awake/alert Behavior During Therapy: WFL for tasks assessed/performed Overall Cognitive Status: Within Functional Limits for tasks assessed                      General Comments      Exercises        Assessment/Plan    PT Assessment Patient needs continued PT services  PT Diagnosis Difficulty walking;Generalized weakness;Acute pain   PT Problem List Decreased strength;Decreased mobility;Pain  PT Treatment Interventions DME instruction;Gait training;Therapeutic exercise   PT Goals (Current goals can be found in the Care Plan section) Acute Rehab PT  Goals Patient Stated Goal: to get stronger PT Goal Formulation: With patient Time For Goal Achievement: 02/21/16 Potential to Achieve Goals: Good    Frequency 7X/week   Barriers to discharge Decreased caregiver support      Co-evaluation               End of Session Equipment Utilized During Treatment:  (R immobilizer for humerus) Activity Tolerance: Patient limited by pain Patient left: in bed;with bed alarm set Nurse Communication: Mobility status         Time: [id number] PT Time Calculation (min) (ACUTE ONLY): 21 min   Charges:   PT Evaluation $PT Eval Moderate Complexity: 1 Procedure     PT G Codes:        Ahmani Prehn,Neetu 2016-02-26, 11:57 AM  Greggory Stallion, PT, DPT [phone]

## 2016-02-07 NOTE — Progress Notes (Signed)
Subjective:  Patient complains of right arm pain. Patient reports pain as marked.  Appreciate oncology consult.    Objective:   VITALS:   Filed Vitals:   02/07/16 0500 02/07/16 0512 02/07/16 1120 02/07/16 1222  BP:  146/71 103/62 134/61  Pulse:  88 89 89  Temp:  98.2 F (36.8 C) 98.9 F (37.2 C) 98.9 F (37.2 C)  TempSrc:  Oral Oral Oral  Resp:  18  20  Height:      Weight: 70.988 kg (156 lb 8 oz)     SpO2:  99% 96% 97%    PHYSICAL EXAM:  Right arm: Patient's skin is intact. She has intact sensation throughout the right upper extremity. She remains in a right shoulder immobilizer. Her fingers are well-perfused and she has intact flexion extension of her digits and wrist.  LABS  Results for orders placed or performed during the hospital encounter of 02/16/2016 (from the past 24 hour(s))  Lactate dehydrogenase     Status: Abnormal   Collection Time: 02/07/16  2:15 PM  Result Value Ref Range   LDH 214 (H) 98 - 192 U/L    Dg Skull 1-3 Views  02/06/2016  CLINICAL DATA:  Assess for myeloma.  Initial encounter. EXAM: SKULL - 1-3 VIEW COMPARISON:  CT of the head performed earlier today at 3:46 p.m. FINDINGS: Evaluation for myeloma is much more optimal on the recent CT of the head. No focal lytic lesions are seen to suggest myeloma. Multiple postoperative defects are noted along the frontal and parietal calvarium, with associated ventriculoperitoneal shunt, which appears grossly intact on provided images. The bony orbits are grossly unremarkable. The visualized paranasal sinuses and mastoid air cells are well-aerated. IMPRESSION: No focal lytic lesions seen to suggest myeloma. Multiple postoperative defects along the frontal and parietal calvarium, with associated ventriculoperitoneal shunt, which appears grossly intact. Evaluation for myeloma is much more optimal on recent CT of the head. Electronically Signed   By: Garald Balding M.D.   On: 02/11/2016 18:31   Dg Chest 1  View  02/19/2016  CLINICAL DATA:  Fall from bed last night, right humeral pain. VP shunt. Lytic destructive lesion in the right superior pubic ramus seen on radiographs today. EXAM: CHEST 1 VIEW COMPARISON:  None. FINDINGS: VP shunt tubing intact were visualized. There is a metal density in the right neck projecting over the shunt on the frontal projection. Linear subsegmental atelectasis or scarring in the lingula. Slight lobularity at the right cardiophrenic junction, probably incidental. Low lung volumes. The patient is rotated to the right on today's radiograph, reducing diagnostic sensitivity and specificity. I do not appreciate a thoracic bony abnormality. 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 IMPRESSION: 1. Displaced right humeral surgical neck fracture, much better seen on the dedicated humeral radiographs. 2. VP shunt tubing intact over the chest. 3. Linear subsegmental atelectasis in the lingula. 4. Slight lobularity along the right cardiophrenic junction, probably from epicardial adipose tissue or a small pericardial cyst. In light of the heightened concern for malignancy based on the lytic lesion in the right superior pubic ramus, this might be further characterized with CT, if clinically warranted. Electronically Signed   By: Van Clines M.D.   On: 01/31/2016 17:10   Dg Pelvis 1-2 Views  01/28/2016  CLINICAL DATA:  Fall on bed last night.  Pain in the right femur. EXAM: PELVIS - 1-2 VIEW COMPARISON:  02/18/2016 radiograph FINDINGS: Destructive  lytic lesion of the right superior pubic ramus. Possible bony fragmentation. The right inferior pubic ramus spurs intact. I do not see any other definite destructive pelvic lesions although there is some asymmetry in density along the iliac bones which might be attributable soft tissues. Coil tubing in the pelvis from VP shunt. IMPRESSION: 1. Lytic destructive bony  lesion in the right superior pubic ramus. Possible underlying pathologic fracture, with very little bony mineralization in this region and also bony expansion. Appearance highly concerning for malignancy such as myeloma or bony metastatic disease. I doubt that this is a bland fracture. Workup for myeloma or other source of malignancy recommended. These results will be called to the ordering clinician or representative by the Radiologist Assistant, and communication documented in the PACS or zVision Dashboard. Electronically Signed   By: Van Clines M.D.   On: 01/28/2016 17:06   Ct Head Wo Contrast  02/14/2016  CLINICAL DATA:  Right-sided weakness and slurred speech. History of aneurysm repair and shunt placement. EXAM: CT HEAD WITHOUT CONTRAST TECHNIQUE: Contiguous axial images were obtained from the base of the skull through the vertex without intravenous contrast. COMPARISON:  11/18/2008 FINDINGS: Sinuses/Soft tissues: Left frontal craniotomy for aneurysm repair. Clear paranasal sinuses and mastoid air cells. Intracranial: Cerebral atrophy. Moderate low density in the periventricular white matter likely related to small vessel disease. More focal encephalomalacia involving the medial frontal lobes. This is felt to be similar to on the most recent exams. Right-sided VP shunt catheter terminates the left lateral ventricle, without hydronephrosis. No cortically based infarct, hydrocephalus, hemorrhage, or mass lesion identified. IMPRESSION: 1.  No acute intracranial abnormality. 2. 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 Surrounding encephalomalacia. 3. Right-sided VP shunt catheter in place without hydrocephalus. 4. Small vessel ischemic change. Report called to Dr. Jacqualine Code at 3:59 p.m. Electronically Signed   By: Abigail Miyamoto M.D.   On: 02/19/2016 16:02   Ct Shoulder Right Wo Contrast  02/06/2016  CLINICAL DATA:  Right humeral pain, fall from bed about  2 days ago. Fracture. EXAM: CT OF THE RIGHT SHOULDER WITHOUT CONTRAST TECHNIQUE: Multidetector CT imaging was performed according to the standard protocol. Multiplanar CT image reconstructions were also generated. COMPARISON:  Humerus radiographs from 02/13/2016 FINDINGS: There is a 2 part surgical neck fracture of the left proximal humerus with abduction of the humeral head component with respect to the shaft component, and with nondisplaced fractures in the greater tuberosity but not a displaced fracture. That said, there is considerable comminution along the dominant fracture plane, with multiple additional bony fragments some of which are imbedded along the fracture plane, and multiple external bony fragments. The largest fragment separate from the head in shaft components is a 3.6 cm fragment on image 48/4, which contains cortical bone and which partially extends along the fracture plane. I do not observe a clavicular or scapular fracture. No left upper rib fracture is seen. Degenerative sternoclavicular arthropathy noted on the right and there is degenerative spurring of the right AC joint. Extensive edema tracks in the subcutaneous tissues the right shoulder. There is also edema tracking superficial to the right pectoralis musculature. Several fragments are present adjacent to the distal right pectoralis tendon which otherwise seems to attach to the shaft fragment. A right lower paratracheal lymph node measures 1.7 cm in short axis on image 56/9. A separate right lower paratracheal node measures 1.1 cm in short axis on image 48/9. I suspect a subcarinal mass measuring 3.4 cm  on image 84/9, although conceivably some type of hiatal hernia might simulated mass in this location. Emphysema noted in the right upper lung. Aortic atherosclerotic calcification is present. Rim calcified 2 cm in long axis left thyroid nodule with other thyroid nodules observed. IMPRESSION: 1. From a technical standpoint the right  proximal humeral surgical neck fracture qualifies as a 2 part fracture, as the fracture plane extending through the greater tuberosity is not displaced but the surgical neck fracture itself is very displaced. However, the surgical neck fracture is considerably comminuted, with multiple fragments projecting along the fracture plane which might impede healing in a conservative management scenario. I do not see a definite underlying bony lesion to suggest that this fracture was pathologic in nature (I am aware of the patient's destructive right superior pubic ramus lesion). 2. Extensive subcutaneous edema along the right shoulder and along the pectoralis musculature. Some of this edema tracks along the right axillary neurovascular structures. 3. There is abnormal pathologic adenopathy in the mediastinum. Possible subcarinal mass. This could be a manifestation of lymphoma, lung cancer, or other malignancy. It may well be related to the lytic lesion in the right superior pubic ramus. I recommend dedicated chest CT (with contrast if feasible) for further workup, and inclusion of the abdomen and pelvis probably be a good idea. 4. Emphysema. 5. Right-sided thyroid nodules. Electronically Signed   By: Van Clines M.D.   On: 02/06/2016 15:00   Dg Chest Port 1 View  02/06/2016  CLINICAL DATA:  Status post PICC line placement EXAM: PORTABLE CHEST 1 VIEW COMPARISON:  02/10/2016 FINDINGS: Cardiac shadow is stable. A new left-sided PICC line is noted with catheter tip at the cavoatrial junction. Ventriculoperitoneal shunt catheter is again seen and stable. Fracture in the proximal right humerus is again identified. IMPRESSION: Status post PICC line placement in satisfactory PICC position Electronically Signed   By: Inez Catalina M.D.   On: 02/06/2016 20:45   Dg Humerus Right  02/07/2016  CLINICAL DATA:  Pt fell out of bed last night and is now complaining of right humeral pain and pain in right femur. EXAM: RIGHT  HUMERUS - 2+ VIEW COMPARISON:  None. FINDINGS: Comminuted fracture surgical neck of the humerus. Three dominant fracture fragments. Fracture fragments show mild to moderate displacement. IMPRESSION: Fracture humerus Electronically Signed   By: Skipper Cliche M.D.   On: 02/03/2016 17:01   Dg Femur, Min 2 Views Right  02/06/2016  CLINICAL DATA:  [place] out of bat last night, right femur pain EXAM: RIGHT FEMUR 2 VIEWS COMPARISON:  None. FINDINGS: Four views of the right femur submitted. No femur fracture or subluxation. There is cortical irregularity with lytic appearance right superior pubic ramus. Pathologic fracture or lytic bone lesion cannot be excluded. Clinical correlation is necessary. Further correlation with MRI or CT scan could be performed. IMPRESSION: No femur fracture or subluxation. There is cortical irregularity with lytic appearance right superior pubic ramus. Pathologic fracture or lytic bone lesion cannot be excluded. Clinical correlation is necessary. Further correlation with MRI or CT scan could be performed. Electronically Signed   By: Lahoma Crocker M.D.   On: 02/17/2016 17:06    Assessment/Plan:     Principal Problem:   Sepsis ([place]) Active Problems:   UTI (urinary tract infection)   Right humeral fracture   COPD (chronic obstructive pulmonary disease) (HCC)   Lymphadenopathy, mediastinal   From an orthopedic standpoint, the patient will remain continue to use her right shoulder immobilizer. She will follow  up with me in the office in 1 week following discharge. Repeat x-rays will be taken of the humerus. A decision regarding surgery and remained at that time. If she requests surgery, patient may be referred to [facility] for definitive surgical management. Patient should not weight-bear on the right upper extremity. She is to avoid active forward elevation or abduction of the right shoulder. She should use the shoulder immobilizer at all times.  Appreciate Dr. Grayland Ormond seen the  patient. He will continue workup as an outpatient.   Thornton Park , MD 02/07/2016, 3:45 PM

## 2016-02-07 NOTE — Progress Notes (Signed)
Pharmacy Antibiotic Note  Leah Wyatt is a 55 y.o. female admitted on 02/07/2016 with bacteremia.  Pharmacy has been consulted for vancomycin dosing.   ID has been consulted. Patient is currently on day #3 of antibiotics on levofloxacin.  Plan: Vancomycin 1000 mg x 1 dose Vancomycin 750 mg IV q12h (beginning at 0200 tomorrow for 8 hour stacked dose) Goal vancomycin trough 15-20 mcg/mL Vancomycin trough scheduled for 6/15 @ 1330 which is prior to the 5th dose and should represent steady state.  Kinetics: Using adjusted body weight of 62 kg Ke: 0.053 Half-life: 13 hrs Vd: 43 L Cmin (estimated) 18 mcg/mL   Height: 5\' 4"  (162.6 cm) Weight: 156 lb 8 oz (70.988 kg) IBW/kg (Calculated) : 54.7  Temp (24hrs), Avg:98.6 F (37 C), Min:98.2 F (36.8 C), Max:98.9 F (37.2 C)   Recent Labs Lab 01/27/2016 1619 02/09/2016 1712 01/26/2016 1956 02/06/16 0443  WBC 21.9*  --   --  12.4*  CREATININE 1.43*  --   --  1.02*  LATICACIDVEN  --  1.0 1.7  --     Estimated Creatinine Clearance: 59.5 mL/min (by C-G formula based on Cr of 1.02).    Allergies  Allergen Reactions  . Penicillins Hives and Swelling    Has patient had a PCN reaction causing immediate rash, facial/tongue/throat swelling, SOB or lightheadedness with hypotension: Yes Has patient had a PCN reaction causing severe rash involving mucus membranes or skin necrosis: No Has patient had a PCN reaction that required hospitalization No Has patient had a PCN reaction occurring within the last 10 years: not sure :If all of the above answers are "NO", then may proceed with Cephalosporin use.  Marland Kitchen Amoxicillin Rash   Antimicrobials this admission: levofloxacin 6/11 >>  vancomycin 6/11 >>  Aztreonam 6/11 >> 6/12  Dose adjustments this admission:  Microbiology results: BCx: staph species UCx: >100K GNR  Thank you for allowing pharmacy to be a part of this patient's care.  Lenis Noon, PharmD 02/07/2016 5:01 PM

## 2016-02-07 NOTE — Consult Note (Signed)
[place]  [phone] [phone]  ID: Leah Wyatt OB: 03/14/1961  MR#: [id number]  [id number]  No care team member to display  CHIEF COMPLAINT:  Chief Complaint  Patient presents with  . Code Stroke    INTERVAL HISTORY: Patient is a 55 year old female with history of brain aneurysm and heavy alcohol use who recently had multiple falls. She recently fell out of bed and was noted to have a right humeral fracture. Subsequent workup included a CT scan which noted some mediastinal lymphadenopathy and concern for mediastinal mass. Patient also noted to have a lytic lesion in her pelvis. Currently, she continues to have right arm pain but otherwise feels well.She has no neurologic complaints. She denies any recent fevers. She is a good appetite and denies weight loss. She denies any other pain. She denies any night sweats. She denies any chest pain or shortness of breath. She denies any nausea, vomiting, constipation, or diarrhea. She has no urinary complaints. Patient otherwise feels well and offers no further specific complaints.  REVIEW OF SYSTEMS:   Review of Systems  Constitutional: Negative.  Negative for fever, weight loss and malaise/fatigue.  Respiratory: Negative.  Negative for cough, hemoptysis and shortness of breath.   Cardiovascular: Negative.  Negative for chest pain.  Gastrointestinal: Negative.  Negative for abdominal pain.  Genitourinary: Negative.   Musculoskeletal: Positive for joint pain and falls.  Neurological: Negative.  Negative for weakness.  Psychiatric/Behavioral: Negative.     As per HPI. Otherwise, a complete review of systems is negatve.  PAST MEDICAL HISTORY: Past Medical History  Diagnosis Date  . Brain aneurysm   . COPD (chronic obstructive pulmonary disease) ([place])     PAST SURGICAL HISTORY: Past Surgical History  Procedure Laterality Date  . Brain surgery      FAMILY HISTORY Family  History  Problem Relation Age of Onset  . Family history unknown: Yes       ADVANCED DIRECTIVES:    HEALTH MAINTENANCE: Social History  Substance Use Topics  . Smoking status: Current Every Day Smoker -- 1.50 packs/day for 20 years    Types: Cigarettes  . Smokeless tobacco: None  . Alcohol Use: No     Colonoscopy:  PAP:  Bone density:  Lipid panel:  Allergies  Allergen Reactions  . Penicillins Hives and Swelling    Has patient had a PCN reaction causing immediate rash, facial/tongue/throat swelling, SOB or lightheadedness with hypotension: Yes Has patient had a PCN reaction causing severe rash involving mucus membranes or skin necrosis: No Has patient had a PCN reaction that required hospitalization No Has patient had a PCN reaction occurring within the last 10 years: not sure :If all of the above answers are "NO", then may proceed with Cephalosporin use.  Marland Kitchen Amoxicillin Rash    Current Facility-Administered Medications  Medication Dose Route Frequency Provider Last Rate Last Dose  . 0.9 % NaCl with KCl 40 mEq / L  infusion   Intravenous Continuous Idelle Crouch, MD 75 mL/hr at 02/07/16 0514 75 mL/hr at 02/07/16 0514  . acetaminophen (TYLENOL) tablet 650 mg  650 mg Oral Q6H PRN Idelle Crouch, MD       Or  . acetaminophen (TYLENOL) suppository 650 mg  650 mg Rectal Q6H PRN Idelle Crouch, MD      . acyclovir ointment (ZOVIRAX) 5 %   Topical Q3H while awake Demetrios Loll, MD      . aspirin EC tablet 81 mg  81  mg Oral Daily Idelle Crouch, MD   81 mg at 02/07/16 1023  . bisacodyl (DULCOLAX) suppository 10 mg  10 mg Rectal Daily PRN Idelle Crouch, MD      . docusate sodium (COLACE) capsule 100 mg  100 mg Oral BID Idelle Crouch, MD   100 mg at 02/06/16 2144  . heparin injection 5,000 Units  5,000 Units Subcutaneous Q8H Idelle Crouch, MD   5,000 Units at 02/07/16 0514  . HYDROcodone-acetaminophen (NORCO/VICODIN) 5-325 MG per tablet 1-2 tablet  1-2 tablet Oral Q4H  PRN Idelle Crouch, MD   2 tablet at 02/07/16 [phone]  . ipratropium-albuterol (DUONEB) 0.5-2.5 (3) MG/3ML nebulizer solution 3 mL  3 mL Nebulization Q4H PRN Idelle Crouch, MD      . Levofloxacin [facility]) IVPB 250 mg  250 mg Intravenous Q24H Lenis Noon, RPH   250 mg at 02/06/16 2137  . loperamide (IMODIUM) capsule 2 mg  2 mg Oral BID PRN Idelle Crouch, MD      . mometasone-formoterol [facility]) 200-5 MCG/ACT inhaler 2 puff  2 puff Inhalation BID Idelle Crouch, MD   2 puff at 02/06/16 2139  . morphine 2 MG/ML injection 2 mg  2 mg Intravenous Q2H PRN Idelle Crouch, MD   2 mg at 02/06/16 0957  . ondansetron (ZOFRAN) tablet 4 mg  4 mg Oral Q6H PRN Idelle Crouch, MD       Or  . ondansetron [facility]) injection 4 mg  4 mg Intravenous Q6H PRN Idelle Crouch, MD      . pantoprazole (PROTONIX) EC tablet 40 mg  40 mg Oral Daily Idelle Crouch, MD   40 mg at 02/07/16 1023  . sertraline (ZOLOFT) tablet 100 mg  100 mg Oral QHS Idelle Crouch, MD   100 mg at 02/06/16 2144  . sodium chloride flush (NS) 0.9 % injection 3 mL  3 mL Intravenous Q12H Idelle Crouch, MD   3 mL at 02/07/16 1000  . tiotropium (SPIRIVA) inhalation capsule 18 mcg  18 mcg Inhalation Daily Idelle Crouch, MD   18 mcg at 02/06/16 0957    OBJECTIVE: Filed Vitals:   02/07/16 1120 02/07/16 1222  BP: 103/62 134/61  Pulse: 89 89  Temp: 98.9 F (37.2 C) 98.9 F (37.2 C)  Resp:  20     Body mass index is 26.85 kg/(m^2).    ECOG FS:0 - Asymptomatic  General: Well-developed, well-nourished, no acute distress. Eyes: Pink conjunctiva, anicteric sclera. HEENT: Normocephalic, moist mucous membranes, clear oropharnyx. Lungs: Clear to auscultation bilaterally. Heart: Regular rate and rhythm. No rubs, murmurs, or gallops. Abdomen: Soft, nontender, nondistended. No organomegaly noted, normoactive bowel sounds. Musculoskeletal: No edema, cyanosis, or clubbing. Right arm in sling. Neuro: Alert, answering all questions  appropriately. Cranial nerves grossly intact. Skin: No rashes or petechiae noted. Psych: Normal affect. Lymphatics: No cervical, calvicular, axillary or inguinal LAD.   LAB RESULTS:  Lab Results  Component Value Date   NA 143 02/06/2016   K 3.7 02/06/2016   CL 115* 02/06/2016   CO2 20* 02/06/2016   GLUCOSE 116* 02/06/2016   BUN 25* 02/06/2016   CREATININE 1.02* 02/06/2016   CALCIUM 8.4* 02/06/2016   PROT 5.6* 02/06/2016   ALBUMIN 2.1* 02/06/2016   AST 52* 02/06/2016   ALT 42 02/06/2016   ALKPHOS 105 02/06/2016   BILITOT 0.1* 02/06/2016   GFRNONAA >60 02/06/2016   GFRAA >60 02/06/2016    Lab Results  Component Value Date   WBC 12.4* 02/06/2016   NEUTROABS 18.8* 02/02/2016   HGB 8.6* 02/06/2016   HCT 26.4* 02/06/2016   MCV 79.3* 02/06/2016   PLT 411 02/06/2016     STUDIES: Dg Skull 1-3 Views  02/01/2016  CLINICAL DATA:  Assess for myeloma.  Initial encounter. EXAM: SKULL - 1-3 VIEW COMPARISON:  CT of the head performed earlier today at 3:46 p.m. FINDINGS: Evaluation for myeloma is much more optimal on the recent CT of the head. No focal lytic lesions are seen to suggest myeloma. Multiple postoperative defects are noted along the frontal and parietal calvarium, with associated ventriculoperitoneal shunt, which appears grossly intact on provided images. The bony orbits are grossly unremarkable. The visualized paranasal sinuses and mastoid air cells are well-aerated. IMPRESSION: No focal lytic lesions seen to suggest myeloma. Multiple postoperative defects along the frontal and parietal calvarium, with associated ventriculoperitoneal shunt, which appears grossly intact. Evaluation for myeloma is much more optimal on recent CT of the head. Electronically Signed   By: Garald Balding M.D.   On: 02/12/2016 18:31   Dg Chest 1 View  01/29/2016  CLINICAL DATA:  Fall from bed last night, right humeral pain. VP shunt. Lytic destructive lesion in the right superior pubic ramus seen on  radiographs today. EXAM: CHEST 1 VIEW COMPARISON:  None. FINDINGS: VP shunt tubing intact were visualized. There is a metal density in the right neck projecting over the shunt on the frontal projection. Linear subsegmental atelectasis or scarring in the lingula. Slight lobularity at the right cardiophrenic junction, probably incidental. Low lung volumes. The patient is rotated to the right on today's radiograph, reducing diagnostic sensitivity and specificity. I do not appreciate a thoracic bony abnormality. 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 IMPRESSION: 1. Displaced right humeral surgical neck fracture, much better seen on the dedicated humeral radiographs. 2. VP shunt tubing intact over the chest. 3. Linear subsegmental atelectasis in the lingula. 4. Slight lobularity along the right cardiophrenic junction, probably from epicardial adipose tissue or a small pericardial cyst. In light of the heightened concern for malignancy based on the lytic lesion in the right superior pubic ramus, this might be further characterized with CT, if clinically warranted. Electronically Signed   By: Van Clines M.D.   On: 02/24/2016 17:10   Dg Pelvis 1-2 Views  02/14/2016  CLINICAL DATA:  Fall on bed last night.  Pain in the right femur. EXAM: PELVIS - 1-2 VIEW COMPARISON:  02/17/2016 radiograph FINDINGS: Destructive lytic lesion of the right superior pubic ramus. Possible bony fragmentation. The right inferior pubic ramus spurs intact. I do not see any other definite destructive pelvic lesions although there is some asymmetry in density along the iliac bones which might be attributable soft tissues. Coil tubing in the pelvis from VP shunt. IMPRESSION: 1. Lytic destructive bony lesion in the right superior pubic ramus. Possible underlying pathologic fracture, with very little bony mineralization in this region and also bony expansion.  Appearance highly concerning for malignancy such as myeloma or bony metastatic disease. I doubt that this is a bland fracture. Workup for myeloma or other source of malignancy recommended. These results will be called to the ordering clinician or representative by the Radiologist Assistant, and communication documented in the PACS or zVision Dashboard. Electronically Signed   By: Van Clines M.D.   On: 02/10/2016 17:06   Ct Head Wo Contrast  02/21/2016  CLINICAL  DATA:  Right-sided weakness and slurred speech. History of aneurysm repair and shunt placement. EXAM: CT HEAD WITHOUT CONTRAST TECHNIQUE: Contiguous axial images were obtained from the base of the skull through the vertex without intravenous contrast. COMPARISON:  11/18/2008 FINDINGS: Sinuses/Soft tissues: Left frontal craniotomy for aneurysm repair. Clear paranasal sinuses and mastoid air cells. Intracranial: Cerebral atrophy. Moderate low density in the periventricular white matter likely related to small vessel disease. More focal encephalomalacia involving the medial frontal lobes. This is felt to be similar to on the most recent exams. Right-sided VP shunt catheter terminates the left lateral ventricle, without hydronephrosis. No cortically based infarct, hydrocephalus, hemorrhage, or mass lesion identified. IMPRESSION: 1.  No acute intracranial abnormality. 2. 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 Surrounding encephalomalacia. 3. Right-sided VP shunt catheter in place without hydrocephalus. 4. Small vessel ischemic change. Report called to Dr. Jacqualine Code at 3:59 p.m. Electronically Signed   By: Abigail Miyamoto M.D.   On: 02/01/2016 16:02   Ct Shoulder Right Wo Contrast  02/06/2016  CLINICAL DATA:  Right humeral pain, fall from bed about 2 days ago. Fracture. EXAM: CT OF THE RIGHT SHOULDER WITHOUT CONTRAST TECHNIQUE: Multidetector CT imaging was performed according to the standard protocol.  Multiplanar CT image reconstructions were also generated. COMPARISON:  Humerus radiographs from 02/01/2016 FINDINGS: There is a 2 part surgical neck fracture of the left proximal humerus with abduction of the humeral head component with respect to the shaft component, and with nondisplaced fractures in the greater tuberosity but not a displaced fracture. That said, there is considerable comminution along the dominant fracture plane, with multiple additional bony fragments some of which are imbedded along the fracture plane, and multiple external bony fragments. The largest fragment separate from the head in shaft components is a 3.6 cm fragment on image 48/4, which contains cortical bone and which partially extends along the fracture plane. I do not observe a clavicular or scapular fracture. No left upper rib fracture is seen. Degenerative sternoclavicular arthropathy noted on the right and there is degenerative spurring of the right AC joint. Extensive edema tracks in the subcutaneous tissues the right shoulder. There is also edema tracking superficial to the right pectoralis musculature. Several fragments are present adjacent to the distal right pectoralis tendon which otherwise seems to attach to the shaft fragment. A right lower paratracheal lymph node measures 1.7 cm in short axis on image 56/9. A separate right lower paratracheal node measures 1.1 cm in short axis on image 48/9. I suspect a subcarinal mass measuring 3.4 cm on image 84/9, although conceivably some type of hiatal hernia might simulated mass in this location. Emphysema noted in the right upper lung. Aortic atherosclerotic calcification is present. Rim calcified 2 cm in long axis left thyroid nodule with other thyroid nodules observed. IMPRESSION: 1. From a technical standpoint the right proximal humeral surgical neck fracture qualifies as a 2 part fracture, as the fracture plane extending through the greater tuberosity is not displaced but the  surgical neck fracture itself is very displaced. However, the surgical neck fracture is considerably comminuted, with multiple fragments projecting along the fracture plane which might impede healing in a conservative management scenario. I do not see a definite underlying bony lesion to suggest that this fracture was pathologic in nature (I am aware of the patient's destructive right superior pubic ramus lesion). 2. Extensive subcutaneous edema along the right shoulder and along the pectoralis musculature. Some of this edema tracks along the  right axillary neurovascular structures. 3. There is abnormal pathologic adenopathy in the mediastinum. Possible subcarinal mass. This could be a manifestation of lymphoma, lung cancer, or other malignancy. It may well be related to the lytic lesion in the right superior pubic ramus. I recommend dedicated chest CT (with contrast if feasible) for further workup, and inclusion of the abdomen and pelvis probably be a good idea. 4. Emphysema. 5. Right-sided thyroid nodules. Electronically Signed   By: Van Clines M.D.   On: 02/06/2016 15:00   Dg Chest Port 1 View  02/06/2016  CLINICAL DATA:  Status post PICC line placement EXAM: PORTABLE CHEST 1 VIEW COMPARISON:  02/13/2016 FINDINGS: Cardiac shadow is stable. A new left-sided PICC line is noted with catheter tip at the cavoatrial junction. Ventriculoperitoneal shunt catheter is again seen and stable. Fracture in the proximal right humerus is again identified. IMPRESSION: Status post PICC line placement in satisfactory PICC position Electronically Signed   By: Inez Catalina M.D.   On: 02/06/2016 20:45   Dg Humerus Right  02/07/2016  CLINICAL DATA:  Pt fell out of bed last night and is now complaining of right humeral pain and pain in right femur. EXAM: RIGHT HUMERUS - 2+ VIEW COMPARISON:  None. FINDINGS: Comminuted fracture surgical neck of the humerus. Three dominant fracture fragments. Fracture fragments show mild to  moderate displacement. IMPRESSION: Fracture humerus Electronically Signed   By: Skipper Cliche M.D.   On: 02/16/2016 17:01   Dg Femur, Min 2 Views Right  01/28/2016  CLINICAL DATA:  [place] out of bat last night, right femur pain EXAM: RIGHT FEMUR 2 VIEWS COMPARISON:  None. FINDINGS: Four views of the right femur submitted. No femur fracture or subluxation. There is cortical irregularity with lytic appearance right superior pubic ramus. Pathologic fracture or lytic bone lesion cannot be excluded. Clinical correlation is necessary. Further correlation with MRI or CT scan could be performed. IMPRESSION: No femur fracture or subluxation. There is cortical irregularity with lytic appearance right superior pubic ramus. Pathologic fracture or lytic bone lesion cannot be excluded. Clinical correlation is necessary. Further correlation with MRI or CT scan could be performed. Electronically Signed   By: Lahoma Crocker M.D.   On: 02/19/2016 17:06    ASSESSMENT: Pathologic mediastinal adenopathy, possible pelvic lytic lesion.  PLAN:    1. Metastatic lymphadenopathy: Unclear etiology, will get a dedicated chest CT to further evaluate. We will also get an abdominal and pelvic CT for completeness. Ultimately, patient may require biopsy but this can be accomplished as an outpatient. Peripheral blood flow cytometry has been ordered and is pending. 2. Pelvic lesion: CT scans as above. Have also ordered SPEP and myeloma workup for completeness.  Appreciate consult. Okay to discharge from an oncology standpoint and follow-up as outpatient if necessary.   Lloyd Huger, MD   02/07/2016 1:58 PM

## 2016-02-07 NOTE — Plan of Care (Signed)
Problem: Pain Managment: Goal: General experience of comfort will improve Outcome: Progressing Norco 2 (two) tablets given for right shoulder pain. Improvement noted.

## 2016-02-08 ENCOUNTER — Inpatient Hospital Stay: Payer: Medicare (Managed Care)

## 2016-02-08 DIAGNOSIS — I469 Cardiac arrest, cause unspecified: Secondary | ICD-10-CM | POA: Diagnosis present

## 2016-02-08 DIAGNOSIS — J9600 Acute respiratory failure, unspecified whether with hypoxia or hypercapnia: Secondary | ICD-10-CM | POA: Diagnosis present

## 2016-02-08 LAB — GLUCOSE, CAPILLARY
GLUCOSE-CAPILLARY: 29 mg/dL — AB (ref 65–99)
Glucose-Capillary: 74 mg/dL (ref 65–99)
Glucose-Capillary: 94 mg/dL (ref 65–99)

## 2016-02-08 LAB — PROTEIN ELECTROPHORESIS, SERUM
A/G RATIO SPE: 0.6 — AB (ref 0.7–1.7)
ALBUMIN ELP: 1.8 g/dL — AB (ref 2.9–4.4)
ALPHA-1-GLOBULIN: 0.6 g/dL — AB (ref 0.0–0.4)
ALPHA-2-GLOBULIN: 0.9 g/dL (ref 0.4–1.0)
BETA GLOBULIN: 0.9 g/dL (ref 0.7–1.3)
Gamma Globulin: 0.6 g/dL (ref 0.4–1.8)
Globulin, Total: 3 g/dL (ref 2.2–3.9)
Total Protein ELP: 4.8 g/dL — ABNORMAL LOW (ref 6.0–8.5)

## 2016-02-08 LAB — RAPID HIV SCREEN (HIV 1/2 AB+AG)
HIV 1/2 ANTIBODIES: NONREACTIVE
HIV-1 P24 ANTIGEN - HIV24: NONREACTIVE

## 2016-02-08 LAB — BASIC METABOLIC PANEL
ANION GAP: 12 (ref 5–15)
BUN: 19 mg/dL (ref 6–20)
CALCIUM: 7.9 mg/dL — AB (ref 8.9–10.3)
CO2: 15 mmol/L — AB (ref 22–32)
Chloride: 108 mmol/L (ref 101–111)
Creatinine, Ser: 0.93 mg/dL (ref 0.44–1.00)
GFR calc Af Amer: >60 mL/min (ref >60)
GFR calc non Af Amer: >60 mL/min (ref >60)
GLUCOSE: 112 mg/dL — AB (ref 65–99)
Potassium: 4.9 mmol/L (ref 3.5–5.1)
Sodium: 135 mmol/L (ref 135–145)

## 2016-02-08 LAB — BLOOD GAS, ARTERIAL
ALLENS TEST (PASS/FAIL): POSITIVE — AB
FIO2: 100
PCO2 ART: 62 mmHg — AB (ref 32.0–48.0)
PEEP: 5 cmH2O
Patient temperature: 37
RATE: 15 resp/min
VT: 500 mL
pH, Arterial: 6.63 — CL (ref 7.350–7.450)
pO2, Arterial: 64 mmHg — ABNORMAL LOW (ref 83.0–108.0)

## 2016-02-08 LAB — CBC
HCT: 23.2 % — ABNORMAL LOW (ref 35.0–47.0)
HEMOGLOBIN: 7.4 g/dL — AB (ref 12.0–16.0)
MCH: 25.6 pg — AB (ref 26.0–34.0)
MCHC: 31.9 g/dL — AB (ref 32.0–36.0)
MCV: 80.4 fL (ref 80.0–100.0)
Platelets: 295 10*3/uL (ref 150–440)
RBC: 2.88 MIL/uL — ABNORMAL LOW (ref 3.80–5.20)
RDW: 15.5 % — ABNORMAL HIGH (ref 11.5–14.5)
WBC: 20.4 10*3/uL — ABNORMAL HIGH (ref 3.6–11.0)

## 2016-02-08 LAB — CULTURE, BLOOD (ROUTINE X 2)

## 2016-02-08 LAB — PROTEIN ELECTRO, RANDOM URINE
ALPHA-1-GLOBULIN, U: 4.2 %
ALPHA-2-GLOBULIN, U: 17.7 %
Albumin ELP, Urine: 7.7 %
BETA GLOBULIN, U: 40.6 %
GAMMA GLOBULIN, U: 29.8 %
Total Protein, Urine: 24.7 mg/dL

## 2016-02-08 LAB — MAGNESIUM: MAGNESIUM: 2.3 mg/dL (ref 1.7–2.4)

## 2016-02-08 LAB — TROPONIN I
Troponin I: 15.99 ng/mL — ABNORMAL HIGH (ref <0.031)
Troponin I: 16.89 ng/mL — ABNORMAL HIGH (ref <0.031)

## 2016-02-08 LAB — MRSA PCR SCREENING: MRSA by PCR: NEGATIVE

## 2016-02-08 LAB — URINE CULTURE

## 2016-02-08 LAB — LACTIC ACID, PLASMA: Lactic Acid, Venous: 11.9 mmol/L (ref 0.5–2.0)

## 2016-02-08 MED ORDER — HEPARIN SODIUM (PORCINE) 5000 UNIT/ML IJ SOLN: 5000.0000 [IU] | Freq: Three times a day (TID) | INTRAMUSCULAR | Status: DC

## 2016-02-08 MED ORDER — LACTATED RINGERS IV SOLN: INTRAVENOUS | Status: DC

## 2016-02-08 MED ORDER — DEXTROSE 5 % IV SOLN
0.5000 ug/min | INTRAVENOUS | Status: DC
  Administered 2016-02-08: 0.5 ug/min via INTRAVENOUS
  Filled 2016-02-08: qty 4

## 2016-02-08 MED ORDER — SODIUM CHLORIDE 0.9 % IV SOLN: INTRAVENOUS | Status: DC

## 2016-02-08 MED ORDER — NALOXONE HCL 0.4 MG/ML IJ SOLN
INTRAMUSCULAR | Status: AC
  Administered 2016-02-08: 13:00:00
  Filled 2016-02-08: qty 1

## 2016-02-08 MED ORDER — NOREPINEPHRINE BITARTRATE 1 MG/ML IV SOLN: 2.0000 ug/min | INTRAVENOUS | Status: DC

## 2016-02-08 MED ORDER — IPRATROPIUM-ALBUTEROL 0.5-2.5 (3) MG/3ML IN SOLN: 3.0000 mL | Freq: Four times a day (QID) | RESPIRATORY_TRACT | Status: DC

## 2016-02-08 MED ORDER — METRONIDAZOLE 50 MG/ML ORAL SUSPENSION: 500.0000 mg | Freq: Three times a day (TID) | ORAL | Status: DC

## 2016-02-08 MED ORDER — ASPIRIN 81 MG PO CHEW
81.0000 mg | CHEWABLE_TABLET | Freq: Every day | ORAL | Status: DC
  Administered 2016-02-08: 81 mg
  Filled 2016-02-08: qty 1

## 2016-02-08 MED ORDER — NICOTINE 14 MG/24HR TD PT24: 14.0000 mg | MEDICATED_PATCH | Freq: Every day | TRANSDERMAL | Status: DC

## 2016-02-08 MED ORDER — NOREPINEPHRINE 4 MG/250ML-% IV SOLN: 2.0000 ug/min | INTRAVENOUS | Status: DC

## 2016-02-08 MED ORDER — SODIUM CHLORIDE 0.9 % IV SOLN: 250.0000 mL | INTRAVENOUS | Status: DC | PRN

## 2016-02-08 MED ORDER — ENOXAPARIN SODIUM 40 MG/0.4ML ~~LOC~~ SOLN
40.0000 mg | SUBCUTANEOUS | Status: DC
  Administered 2016-02-08: 40 mg via SUBCUTANEOUS
  Filled 2016-02-08: qty 0.4

## 2016-02-08 MED ORDER — DEXTROSE 5 % IV SOLN
2.0000 g | Freq: Once | INTRAVENOUS | Status: AC
  Administered 2016-02-08: 2 g via INTRAVENOUS
  Filled 2016-02-08: qty 2

## 2016-02-08 MED ORDER — MIDAZOLAM HCL 2 MG/2ML IJ SOLN: 2.0000 mg | INTRAMUSCULAR | Status: DC | PRN

## 2016-02-08 MED ORDER — DEXTROSE 5 % IV SOLN: 2.0000 g | INTRAVENOUS | Status: DC

## 2016-02-08 MED ORDER — FAMOTIDINE IN NACL 20-0.9 MG/50ML-% IV SOLN
20.0000 mg | Freq: Two times a day (BID) | INTRAVENOUS | Status: DC
  Administered 2016-02-08: 20 mg via INTRAVENOUS
  Filled 2016-02-08 (×3): qty 50

## 2016-02-08 MED ORDER — FENTANYL CITRATE (PF) 100 MCG/2ML IJ SOLN: 100.0000 ug | INTRAMUSCULAR | Status: DC | PRN

## 2016-02-08 MED ORDER — ACETAMINOPHEN 325 MG PO TABS: 650.0000 mg | ORAL_TABLET | Freq: Four times a day (QID) | ORAL | Status: DC | PRN

## 2016-02-08 MED ORDER — METRONIDAZOLE 250 MG PO TABS: 500.0000 mg | ORAL_TABLET | Freq: Three times a day (TID) | ORAL | Status: DC

## 2016-02-08 MED ORDER — LACTATED RINGERS IV SOLN
INTRAVENOUS | Status: DC
  Administered 2016-02-08: 16:00:00 via INTRAVENOUS

## 2016-02-08 MED ORDER — SODIUM CHLORIDE 0.9 % IV BOLUS (SEPSIS): 250.0000 mL | Freq: Once | INTRAVENOUS | Status: DC

## 2016-02-08 MED FILL — Medication: Qty: 1 | Status: AC

## 2016-02-10 LAB — CULTURE, BLOOD (ROUTINE X 2): CULTURE: NO GROWTH

## 2016-02-10 LAB — BLOOD GAS, VENOUS
pCO2, Ven: 23 mmHg — ABNORMAL LOW (ref 44.0–60.0)
pH, Ven: 7.35 (ref 7.320–7.430)

## 2016-02-10 LAB — CULTURE, RESPIRATORY: CULTURE: NORMAL

## 2016-02-10 LAB — CULTURE, RESPIRATORY W GRAM STAIN

## 2016-02-13 LAB — COMP PANEL: LEUKEMIA/LYMPHOMA

## 2016-02-13 LAB — IGG, IGA, IGM
IGA: 254 mg/dL (ref 87–352)
IGG (IMMUNOGLOBIN G), SERUM: 647 mg/dL — AB (ref 700–1600)
IGM, SERUM: 51 mg/dL (ref 26–217)

## 2016-02-13 LAB — KAPPA/LAMBDA LIGHT CHAINS
KAPPA, LAMDA LIGHT CHAIN RATIO: 0.81 (ref 0.26–1.65)
Kappa free light chain: 33.1 mg/L — ABNORMAL HIGH (ref 3.3–19.4)
Lambda free light chains: 40.8 mg/L — ABNORMAL HIGH (ref 5.7–26.3)

## 2016-02-14 MED FILL — Medication: Qty: 1 | Status: AC

## 2016-02-25 NOTE — Consult Note (Addendum)
[facility] Infectious Disease     Reason for Consult:Sepsis    Referring Physician: Estanislado Spire Date of Admission:  02/01/2016   Principal Problem:   Sepsis ([place]) Active Problems:   UTI (urinary tract infection)   Right humeral fracture   COPD (chronic obstructive pulmonary disease) (HCC)   Lymphadenopathy, mediastinal   Acute respiratory failure (HCC)   HPI: Leah Wyatt is a 55 y.o. female admitted 6/11 with weakness, R arm pain after fall. On admit was hypotensive with wbc 22 and UA with TNTC WBC.  Received levo adn aztreonam 6/11 then changed to just levo 6/12- current and vanco added 6/13. UCX > 100 K Klebsiella. [place] with CNS.  6/14 she vomited became unresponsive, and is now intubated. Had CPR x 9 minutes.  Also found to have pathological fx of humerus and imagine suggests metastatic cancer with possible subcarinal mass, and mediastinal LAN.   Past Medical History  Diagnosis Date  . Brain aneurysm   . COPD (chronic obstructive pulmonary disease) [facility])    Past Surgical History  Procedure Laterality Date  . Brain surgery     Social History  Substance Use Topics  . Smoking status: Current Every Day Smoker -- 1.50 packs/day for 20 years    Types: Cigarettes  . Smokeless tobacco: None  . Alcohol Use: No   Family History  Problem Relation Age of Onset  . Family history unknown: Yes    Allergies:  Allergies  Allergen Reactions  . Penicillins Hives and Swelling    Has patient had a PCN reaction causing immediate rash, facial/tongue/throat swelling, SOB or lightheadedness with hypotension: Yes Has patient had a PCN reaction causing severe rash involving mucus membranes or skin necrosis: No Has patient had a PCN reaction that required hospitalization No Has patient had a PCN reaction occurring within the last 10 years: not sure :If all of the above answers are "NO", then may proceed with Cephalosporin use.  Marland Kitchen Amoxicillin Rash    Current  antibiotics: Antibiotics Given (last 72 hours)    Date/Time Action Medication Dose Rate   02/06/16 0257 Given   aztreonam (AZACTAM) 1 g in dextrose 5 % 50 mL IVPB 1 g 100 mL/hr   02/06/16 2137 Given   Levofloxacin (LEVAQUIN) IVPB 250 mg 250 mg 50 mL/hr   02/07/16 1811 Given   vancomycin (VANCOCIN) IVPB 1000 mg/200 mL premix 1,000 mg 200 mL/hr   02/07/16 2123 Given   Levofloxacin (LEVAQUIN) IVPB 250 mg 250 mg 50 mL/hr   February 12, 2016 0200 Given   vancomycin (VANCOCIN) IVPB 750 mg/150 ml premix 750 mg 150 mL/hr      MEDICATIONS: . acyclovir ointment   Topical Q3H while awake  . aspirin EC  81 mg Oral Daily  . docusate sodium  100 mg Oral BID  . enoxaparin (LOVENOX) injection  40 mg Subcutaneous Q24H  . famotidine (PEPCID) IV  20 mg Intravenous Q12H  . heparin  5,000 Units Subcutaneous Q8H  . levofloxacin (LEVAQUIN) IV  250 mg Intravenous Q24H  . mometasone-formoterol  2 puff Inhalation BID  . naloxone      . nicotine  14 mg Transdermal Daily  . pantoprazole  40 mg Oral Daily  . sertraline  100 mg Oral QHS  . sodium chloride flush  3 mL Intravenous Q12H  . tiotropium  18 mcg Inhalation Daily  . vancomycin  750 mg Intravenous Q12H    Review of Systems - intubated   OBJECTIVE: Temp:  [97.6 F (36.4 C)-98.6  F (37 C)] 98 F (36.7 C) (06/14 1310) Pulse Rate:  [76-105] 83 (06/14 1320) Resp:  [15-22] 15 (06/14 1325) BP: (83-136)/(56-82) 114/73 mmHg (06/14 1325) SpO2:  [34 %-98 %] 34 % (06/14 1320) Weight:  [71.396 kg (157 lb 6.4 oz)] 71.396 kg (157 lb 6.4 oz) (06/14 0500) Physical Exam  Constitutional:  Intubated, critically ill appearing, ashen gray HENT: [place]/AT, no scleral icterus Mouth/Throat: Oropharynx is clear and dry, ett in place.  Cardiovascular: Tachy , re  Pulmonary/Chest: Mech bs  Neck = supple, no nuchal rigidity Abdominal: Soft. Bowel sounds are normal.  exhibits no distension. There is no tenderness.  Lymphadenopathy: no cervical adenopathy. No axillary  adenopathy Neurological: intubated Ext cool  Skin: ashen gray  LABS: Results for orders placed or performed during the hospital encounter of 02/01/2016 (from the past 48 hour(s))  Lactate dehydrogenase     Status: Abnormal   Collection Time: 02/07/16  2:15 PM  Result Value Ref Range   LDH 214 (H) 98 - 192 U/L  Basic metabolic panel     Status: Abnormal   Collection Time: February 26, 2016  5:00 AM  Result Value Ref Range   Sodium 135 135 - 145 mmol/L   Potassium 4.9 3.5 - 5.1 mmol/L   Chloride 108 101 - 111 mmol/L   CO2 15 (L) 22 - 32 mmol/L   Glucose, Bld 112 (H) 65 - 99 mg/dL   BUN 19 6 - 20 mg/dL   Creatinine, Ser 0.93 0.44 - 1.00 mg/dL   Calcium 7.9 (L) 8.9 - 10.3 mg/dL   GFR calc non Af Amer >60 >60 mL/min   GFR calc Af Amer >60 >60 mL/min    Comment: (NOTE) The eGFR has been calculated using the CKD EPI equation. This calculation has not been validated in all clinical situations. eGFR's persistently <60 mL/min signify possible Chronic Kidney Disease.    Anion gap 12 5 - 15  CBC     Status: Abnormal   Collection Time: 02/26/2016  5:00 AM  Result Value Ref Range   WBC 20.4 (H) 3.6 - 11.0 K/uL   RBC 2.88 (L) 3.80 - 5.20 MIL/uL   Hemoglobin 7.4 (L) 12.0 - 16.0 g/dL   HCT 23.2 (L) 35.0 - 47.0 %   MCV 80.4 80.0 - 100.0 fL   MCH 25.6 (L) 26.0 - 34.0 pg   MCHC 31.9 (L) 32.0 - 36.0 g/dL   RDW 15.5 (H) 11.5 - 14.5 %   Platelets 295 150 - 440 K/uL  Glucose, capillary     Status: Abnormal   Collection Time: 2016/02/26 12:55 PM  Result Value Ref Range   Glucose-Capillary 29 (LL) 65 - 99 mg/dL   Comment 1 Notify RN   Glucose, capillary     Status: None   Collection Time: 2016/02/26 12:57 PM  Result Value Ref Range   Glucose-Capillary 74 65 - 99 mg/dL   No components found for: ESR, C REACTIVE PROTEIN MICRO: Recent Results (from the past 720 hour(s))  Urine culture     Status: Abnormal   Collection Time: 02/24/2016  5:12 PM  Result Value Ref Range Status   Specimen Description URINE,  RANDOM  Final   Special Requests NONE  Final   Culture >=100,000 COLONIES/mL KLEBSIELLA PNEUMONIAE (A)  Final   Report Status 02-26-16 FINAL  Final   Organism ID, Bacteria KLEBSIELLA PNEUMONIAE (A)  Final      Susceptibility   Klebsiella pneumoniae - MIC*    AMPICILLIN >=32 RESISTANT Resistant  CEFAZOLIN <=4 SENSITIVE Sensitive     CEFTRIAXONE <=1 SENSITIVE Sensitive     CIPROFLOXACIN <=0.25 SENSITIVE Sensitive     GENTAMICIN <=1 SENSITIVE Sensitive     IMIPENEM <=0.25 SENSITIVE Sensitive     NITROFURANTOIN 64 INTERMEDIATE Intermediate     TRIMETH/SULFA <=20 SENSITIVE Sensitive     AMPICILLIN/SULBACTAM 4 SENSITIVE Sensitive     PIP/TAZO <=4 SENSITIVE Sensitive     Extended ESBL NEGATIVE Sensitive     * >=100,000 COLONIES/mL KLEBSIELLA PNEUMONIAE  Culture, blood (Routine X 2) w Reflex to ID Panel     Status: None (Preliminary result)   Collection Time: 01/27/2016  5:25 PM  Result Value Ref Range Status   Specimen Description BLOOD LT HAND  Final   Special Requests BOTTLES DRAWN AEROBIC AND ANAEROBIC 1CC  Final   Culture NO GROWTH 3 DAYS  Final   Report Status PENDING  Incomplete  Culture, blood (Routine X 2) w Reflex to ID Panel     Status: Abnormal   Collection Time: 02/11/2016  5:30 PM  Result Value Ref Range Status   Specimen Description BLOOD BLOOD LEFT FOREARM  Final   Special Requests BOTTLES DRAWN AEROBIC AND ANAEROBIC 1CC  Final   Culture  Setup Time   Final    GRAM POSITIVE COCCI IN BOTH AEROBIC AND ANAEROBIC BOTTLES CRITICAL RESULT CALLED TO, READ BACK BY AND VERIFIED WITH: LISA KLUTTZ AT 1640 ON 02/06/16 BY [facility] Organism ID to follow    Culture (A)  Final    STAPHYLOCOCCUS SPECIES (COAGULASE NEGATIVE) THE SIGNIFICANCE OF ISOLATING THIS ORGANISM FROM A SINGLE SET OF BLOOD CULTURES WHEN MULTIPLE SETS ARE DRAWN IS UNCERTAIN. PLEASE NOTIFY THE MICROBIOLOGY DEPARTMENT WITHIN ONE WEEK IF SPECIATION AND SENSITIVITIES ARE REQUIRED. Performed at [facility]     Report Status February 25, 2016 FINAL  Final  Blood Culture ID Panel (Reflexed)     Status: Abnormal   Collection Time: 02/12/2016  5:30 PM  Result Value Ref Range Status   Enterococcus species NOT DETECTED NOT DETECTED Final   Vancomycin resistance NOT DETECTED NOT DETECTED Final   Listeria monocytogenes NOT DETECTED NOT DETECTED Final   Staphylococcus species DETECTED (A) NOT DETECTED Final    Comment: CRITICAL RESULT CALLED TO, READ BACK BY AND VERIFIED WITH: LISA KLUTTZ ON 02/06/16 AT 1640 BY [facility]    Staphylococcus aureus NOT DETECTED NOT DETECTED Final   Methicillin resistance NOT DETECTED NOT DETECTED Final   Streptococcus species NOT DETECTED NOT DETECTED Final   Streptococcus agalactiae NOT DETECTED NOT DETECTED Final   Streptococcus pneumoniae NOT DETECTED NOT DETECTED Final   Streptococcus pyogenes NOT DETECTED NOT DETECTED Final   Acinetobacter baumannii NOT DETECTED NOT DETECTED Final   Enterobacteriaceae species NOT DETECTED NOT DETECTED Final   Enterobacter cloacae complex NOT DETECTED NOT DETECTED Final   Escherichia coli NOT DETECTED NOT DETECTED Final   Klebsiella oxytoca NOT DETECTED NOT DETECTED Final   Klebsiella pneumoniae NOT DETECTED NOT DETECTED Final   Proteus species NOT DETECTED NOT DETECTED Final   Serratia marcescens NOT DETECTED NOT DETECTED Final   Carbapenem resistance NOT DETECTED NOT DETECTED Final   Haemophilus influenzae NOT DETECTED NOT DETECTED Final   Neisseria meningitidis NOT DETECTED NOT DETECTED Final   Pseudomonas aeruginosa NOT DETECTED NOT DETECTED Final   Candida albicans NOT DETECTED NOT DETECTED Final   Candida glabrata NOT DETECTED NOT DETECTED Final   Candida krusei NOT DETECTED NOT DETECTED Final   Candida parapsilosis NOT DETECTED NOT DETECTED Final  Candida tropicalis NOT DETECTED NOT DETECTED Final    IMAGING: Dg Skull 1-3 Views  02/04/2016  CLINICAL DATA:  Assess for myeloma.  Initial encounter. EXAM: SKULL - 1-3 VIEW COMPARISON:  CT  of the head performed earlier today at 3:46 p.m. FINDINGS: Evaluation for myeloma is much more optimal on the recent CT of the head. No focal lytic lesions are seen to suggest myeloma. Multiple postoperative defects are noted along the frontal and parietal calvarium, with associated ventriculoperitoneal shunt, which appears grossly intact on provided images. The bony orbits are grossly unremarkable. The visualized paranasal sinuses and mastoid air cells are well-aerated. IMPRESSION: No focal lytic lesions seen to suggest myeloma. Multiple postoperative defects along the frontal and parietal calvarium, with associated ventriculoperitoneal shunt, which appears grossly intact. Evaluation for myeloma is much more optimal on recent CT of the head. Electronically Signed   By: Garald Balding M.D.   On: 02/07/2016 18:31   Dg Chest 1 View  01/27/2016  CLINICAL DATA:  Fall from bed last night, right humeral pain. VP shunt. Lytic destructive lesion in the right superior pubic ramus seen on radiographs today. EXAM: CHEST 1 VIEW COMPARISON:  None. FINDINGS: VP shunt tubing intact were visualized. There is a metal density in the right neck projecting over the shunt on the frontal projection. Linear subsegmental atelectasis or scarring in the lingula. Slight lobularity at the right cardiophrenic junction, probably incidental. Low lung volumes. The patient is rotated to the right on today's radiograph, reducing diagnostic sensitivity and specificity. I do not appreciate a thoracic bony abnormality. 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 IMPRESSION: 1. Displaced right humeral surgical neck fracture, much better seen on the dedicated humeral radiographs. 2. VP shunt tubing intact over the chest. 3. Linear subsegmental atelectasis in the lingula. 4. Slight lobularity along the right cardiophrenic junction, probably from epicardial adipose tissue or  a small pericardial cyst. In light of the heightened concern for malignancy based on the lytic lesion in the right superior pubic ramus, this might be further characterized with CT, if clinically warranted. Electronically Signed   By: Van Clines M.D.   On: 02/10/2016 17:10   Dg Pelvis 1-2 Views  02/06/2016  CLINICAL DATA:  Fall on bed last night.  Pain in the right femur. EXAM: PELVIS - 1-2 VIEW COMPARISON:  01/27/2016 radiograph FINDINGS: Destructive lytic lesion of the right superior pubic ramus. Possible bony fragmentation. The right inferior pubic ramus spurs intact. I do not see any other definite destructive pelvic lesions although there is some asymmetry in density along the iliac bones which might be attributable soft tissues. Coil tubing in the pelvis from VP shunt. IMPRESSION: 1. Lytic destructive bony lesion in the right superior pubic ramus. Possible underlying pathologic fracture, with very little bony mineralization in this region and also bony expansion. Appearance highly concerning for malignancy such as myeloma or bony metastatic disease. I doubt that this is a bland fracture. Workup for myeloma or other source of malignancy recommended. These results will be called to the ordering clinician or representative by the Radiologist Assistant, and communication documented in the PACS or zVision Dashboard. Electronically Signed   By: Van Clines M.D.   On: 01/27/2016 17:06   Ct Head Wo Contrast  02/13/2016  CLINICAL DATA:  Right-sided weakness and slurred speech. History of aneurysm repair and shunt placement. EXAM: CT HEAD WITHOUT CONTRAST TECHNIQUE: Contiguous axial images were obtained from the  base of the skull through the vertex without intravenous contrast. COMPARISON:  11/18/2008 FINDINGS: Sinuses/Soft tissues: Left frontal craniotomy for aneurysm repair. Clear paranasal sinuses and mastoid air cells. Intracranial: Cerebral atrophy. Moderate low density in the periventricular  white matter likely related to small vessel disease. More focal encephalomalacia involving the medial frontal lobes. This is felt to be similar to on the most recent exams. Right-sided VP shunt catheter terminates the left lateral ventricle, without hydronephrosis. No cortically based infarct, hydrocephalus, hemorrhage, or mass lesion identified. IMPRESSION: 1.  No acute intracranial abnormality. 2. 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 Surrounding encephalomalacia. 3. Right-sided VP shunt catheter in place without hydrocephalus. 4. Small vessel ischemic change. Report called to Dr. Jacqualine Code at 3:59 p.m. Electronically Signed   By: Abigail Miyamoto M.D.   On: 02/18/2016 16:02   Ct Shoulder Right Wo Contrast  02/06/2016  CLINICAL DATA:  Right humeral pain, fall from bed about 2 days ago. Fracture. EXAM: CT OF THE RIGHT SHOULDER WITHOUT CONTRAST TECHNIQUE: Multidetector CT imaging was performed according to the standard protocol. Multiplanar CT image reconstructions were also generated. COMPARISON:  Humerus radiographs from 02/22/2016 FINDINGS: There is a 2 part surgical neck fracture of the left proximal humerus with abduction of the humeral head component with respect to the shaft component, and with nondisplaced fractures in the greater tuberosity but not a displaced fracture. That said, there is considerable comminution along the dominant fracture plane, with multiple additional bony fragments some of which are imbedded along the fracture plane, and multiple external bony fragments. The largest fragment separate from the head in shaft components is a 3.6 cm fragment on image 48/4, which contains cortical bone and which partially extends along the fracture plane. I do not observe a clavicular or scapular fracture. No left upper rib fracture is seen. Degenerative sternoclavicular arthropathy noted on the right and there is degenerative spurring of the right AC  joint. Extensive edema tracks in the subcutaneous tissues the right shoulder. There is also edema tracking superficial to the right pectoralis musculature. Several fragments are present adjacent to the distal right pectoralis tendon which otherwise seems to attach to the shaft fragment. A right lower paratracheal lymph node measures 1.7 cm in short axis on image 56/9. A separate right lower paratracheal node measures 1.1 cm in short axis on image 48/9. I suspect a subcarinal mass measuring 3.4 cm on image 84/9, although conceivably some type of hiatal hernia might simulated mass in this location. Emphysema noted in the right upper lung. Aortic atherosclerotic calcification is present. Rim calcified 2 cm in long axis left thyroid nodule with other thyroid nodules observed. IMPRESSION: 1. From a technical standpoint the right proximal humeral surgical neck fracture qualifies as a 2 part fracture, as the fracture plane extending through the greater tuberosity is not displaced but the surgical neck fracture itself is very displaced. However, the surgical neck fracture is considerably comminuted, with multiple fragments projecting along the fracture plane which might impede healing in a conservative management scenario. I do not see a definite underlying bony lesion to suggest that this fracture was pathologic in nature (I am aware of the patient's destructive right superior pubic ramus lesion). 2. Extensive subcutaneous edema along the right shoulder and along the pectoralis musculature. Some of this edema tracks along the right axillary neurovascular structures. 3. There is abnormal pathologic adenopathy in the mediastinum. Possible subcarinal mass. This could be a manifestation of lymphoma, lung cancer, or other  malignancy. It may well be related to the lytic lesion in the right superior pubic ramus. I recommend dedicated chest CT (with contrast if feasible) for further workup, and inclusion of the abdomen and pelvis  probably be a good idea. 4. Emphysema. 5. Right-sided thyroid nodules. Electronically Signed   By: Van Clines M.D.   On: 02/06/2016 15:00   Dg Chest Port 1 View  02/06/2016  CLINICAL DATA:  Status post PICC line placement EXAM: PORTABLE CHEST 1 VIEW COMPARISON:  02/18/2016 FINDINGS: Cardiac shadow is stable. A new left-sided PICC line is noted with catheter tip at the cavoatrial junction. Ventriculoperitoneal shunt catheter is again seen and stable. Fracture in the proximal right humerus is again identified. IMPRESSION: Status post PICC line placement in satisfactory PICC position Electronically Signed   By: Inez Catalina M.D.   On: 02/06/2016 20:45   Dg Humerus Right  02/22/2016  CLINICAL DATA:  Pt fell out of bed last night and is now complaining of right humeral pain and pain in right femur. EXAM: RIGHT HUMERUS - 2+ VIEW COMPARISON:  None. FINDINGS: Comminuted fracture surgical neck of the humerus. Three dominant fracture fragments. Fracture fragments show mild to moderate displacement. IMPRESSION: Fracture humerus Electronically Signed   By: Skipper Cliche M.D.   On: 02/22/2016 17:01   Dg Femur, Min 2 Views Right  02/24/2016  CLINICAL DATA:  [place] out of bat last night, right femur pain EXAM: RIGHT FEMUR 2 VIEWS COMPARISON:  None. FINDINGS: Four views of the right femur submitted. No femur fracture or subluxation. There is cortical irregularity with lytic appearance right superior pubic ramus. Pathologic fracture or lytic bone lesion cannot be excluded. Clinical correlation is necessary. Further correlation with MRI or CT scan could be performed. IMPRESSION: No femur fracture or subluxation. There is cortical irregularity with lytic appearance right superior pubic ramus. Pathologic fracture or lytic bone lesion cannot be excluded. Clinical correlation is necessary. Further correlation with MRI or CT scan could be performed. Electronically Signed   By: Lahoma Crocker M.D.   On: 02/23/2016 17:06     Assessment:   Leah Wyatt is a 55 y.o. female with likely undiagnosed malignancy noted on imaging, admitted with weakness and found to have leukpcytosis and UTI with Colette Ribas.  Has coag neg staph in 1/2 bcx.  Has since coded and possibly aspirated and is currently intubated. She is PCN allergic.  Should cover for now for UTI and Aspiration  Recommendations The CNS bacteremia is likely contaminant- dc vanco Klebsiella PNA UTI - pansensitive except to amp Possible aspiration   Would rec ceftraixone and flagyl to cover the UTI and Aspiration  Thank you very much for allowing me to participate in the care of this patient. Please call with questions.   Cheral Marker. Ola Spurr, MD

## 2016-02-25 NOTE — Progress Notes (Signed)
Code blue note.  The patient was found unresponsive and blue. No pulses were found, CPR was performed, code blue was called. Epi x 2 were given. Heart rate resumed after 9 min CPR. She was intubated. BS 29, repeat 74.  Given D50 iv. Crackles and wheezing in both lungs. Transferred to ICU.  Acute respiratory failure Acute metabolic encephalopathy. Check  CXR, CBC, BMP, troponin, lactic acid. I discussed with pt's mother and called her sister. She is coming.

## 2016-02-25 NOTE — Progress Notes (Signed)
Pharmacy Antibiotic Note  Leah Wyatt is a 55 y.o. female admitted on 02/11/2016 with UTI.  Pharmacy has been consulted for ceftriaxone dosing.  Plan: Patient admitted to the unit. Will initiate patient on ceftriaxone 2g IV Q24hr. Patient allergic to penicillins (hives and swelling). Will monitor for reaction.  Height: 5\' 4"  (162.6 cm) Weight: 157 lb 6.4 oz (71.396 kg) IBW/kg (Calculated) : 54.7  Temp (24hrs), Avg:98 F (36.7 C), Min:97.6 F (36.4 C), Max:98.6 F (37 C)   Recent Labs Lab 02/21/2016 1619 02/02/2016 1712 02/13/2016 1956 02/06/16 0443 02/25/16 0500  WBC 21.9*  --   --  12.4* 20.4*  CREATININE 1.43*  --   --  1.02* 0.93  LATICACIDVEN  --  1.0 1.7  --   --     Estimated Creatinine Clearance: 65.5 mL/min (by C-G formula based on Cr of 0.93).    Allergies  Allergen Reactions  . Penicillins Hives and Swelling    Has patient had a PCN reaction causing immediate rash, facial/tongue/throat swelling, SOB or lightheadedness with hypotension: Yes Has patient had a PCN reaction causing severe rash involving mucus membranes or skin necrosis: No Has patient had a PCN reaction that required hospitalization No Has patient had a PCN reaction occurring within the last 10 years: not sure :If all of the above answers are "NO", then may proceed with Cephalosporin use.  Marland Kitchen Amoxicillin Rash    Antimicrobials this admission: Aztreonam 6/11 >> 6/12 Levofloxacin 6/11 >> 6/14 Vancomycin 6/12 >> 6/13 Ceftriaxone 6/14 >>  Metronidazole 6/14 >>  Dose adjustments this admission: N/A  Microbiology results: 6/11 BCx: coag negative staph  6/11 UCx: Klebsiella Pneumonia  6/14 Sputum: collected 6/14 MRSA PCR: pending   Pharmacy will continue to monitor and adjust per protocol.   Simpson,Michael L 02-25-2016 2:08 PM

## 2016-02-25 NOTE — Discharge Summary (Signed)
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 female has a past medical history significant for COPD and previous CVA with aneurysm clipping now with progressive weakness and right arm pain. Pt fell out of bed earlier today. No fever. No N/V/D. Denies CP or SOB. Was brought to the ER due to weakness and arm pain where she was found to be hypotensive with WBC=22k and UTI. Also noted was a right humeral fracture and K+=2.4.   HOSPITAL COURSE:   Initially responded well to therapies started on admission. On 02/12/23, plans were underway for discharge. She developed nausea and vomiting with subsequent cardiac arrest and 9 mins of ACLS. She was intubated and transferred to the ICU where she was supported on mechanical ventilation with antibiotics and vasopressors. She sufferd cardiac arrest on two more occasions and underwent ACLS with transient restoration of pulse and rhythm. Discussions re: prognosis and golas of care were initiated with family shortly after pt's  arrival to ICU. After the third round of ACLS, the family requested that no more heroic efforts be undertaken. The pt progressed to asystole again shortly thereafter and was pronounced dead @ 1745 on Feb 12, 2016. Autopsy was offered and declined by family    Cause of death:  Cardiac arrest post aspiration event with elevated trop I  Contributing factors: COPD Sepsis UTI  Autopsy: No  Smoking contributed?: Yes   Merton Border, MD PCCM service Mobile [phone] Pager [phone] 02/10/2016

## 2016-02-25 NOTE — Progress Notes (Signed)
Physical Therapy Treatment Patient Details Name: Edrina Araya MRN: [id number] DOB: [birth year] Today's Date: 03/04/2016    History of Present Illness Pt admitted for sepsis. Pt with history of brain aneurysm, COPD and previous CVA. Pt with now complaints of R arm weakness/pain. Pt with multiple falls recently. Prior to arrival, pt fell out of bed and now has R humeral fracture (greater tuberosity and surgical neck). Pt now in immobilizer and no surgical plans at this time per Ortho. Pt also with lytic lesion on R superior pubic ramus. Pt with recent PICC placement.    PT Comments    Pt reports terrible pain in right shoulder/upper extremity (50+), and 3/10 in R groin. Pt refuses up in bed/out of bed, but agreeable to bed exercises. Pt instructed and participates, but does require re direction to task at hand often, as pt easily distractible and talks a lot about unrelated subjects. Pt provided with written exercises program and instructed to perform 3-4 times per day. Family instructed as well.   Follow Up Recommendations  SNF     Equipment Recommendations       Recommendations for Other Services       Precautions / Restrictions Precautions Precautions: Fall Required Braces or Orthoses: Other Brace/Splint Other Brace/Splint: RUE sling Restrictions Weight Bearing Restrictions: Yes RUE Weight Bearing: Non weight bearing    Mobility  Bed Mobility               General bed mobility comments: Refuses up in bed/out of bed  Transfers                    Ambulation/Gait                 Stairs            Wheelchair Mobility    Modified Rankin (Stroke Patients Only)       Balance                                    Cognition Arousal/Alertness: Awake/alert Behavior During Therapy: WFL for tasks assessed/performed Overall Cognitive Status: Within Functional Limits for tasks assessed                       Exercises General Exercises - Lower Extremity Ankle Circles/Pumps: AROM;Both;20 reps;Supine Quad Sets: Strengthening;Both;20 reps;Supine Gluteal Sets: Strengthening;Both;20 reps;Supine Short Arc Quad: AROM;Both;20 reps;Supine Heel Slides: AROM;Both;20 reps;Supine Hip ABduction/ADduction: AAROM;Right;20 reps;Supine (AROM L) Straight Leg Raises: AAROM;Both;20 reps;Supine    General Comments        Pertinent Vitals/Pain Pain Assessment: 0-10 Pain Score:  (50+ RUE; 3 R hip) Pain Location: RUE/R hip/groin Pain Intervention(s): Limited activity within patient's tolerance;Monitored during session    Home Living                      Prior Function            PT Goals (current goals can now be found in the care plan section) Progress towards PT goals: Progressing toward goals (slowly)    Frequency  7X/week    PT Plan Current plan remains appropriate    Co-evaluation             End of Session   Activity Tolerance: Patient tolerated treatment well;No increased pain;Patient limited by pain Patient left: in bed;with call bell/phone within reach;with bed alarm set;with family/visitor present  Time: [id number] PT Time Calculation (min) (ACUTE ONLY): 26 min  Charges:  $Therapeutic Exercise: 23-37 mins                    G Codes:      Charlaine Dalton, PTA 02/09/2016, 11:28 AM

## 2016-02-25 NOTE — ED Provider Notes (Signed)
Department of Emergency Medicine   Code Blue CONSULT NOTE  Chief Complaint: Cardiac arrest/unresponsive   Level V Caveat: Unresponsive  History of present illness: I was contacted by the hospital for a CODE BLUE cardiac arrest upstairs and presented to the patient's bedside.   Per medicine doctor patient was admitted for respiratory distress and sepsis. Was doing okay this morning. Per nursing staff received pain medicine roughly one hour prior to the code.   ROS: Unable to obtain, Level V caveat  Scheduled Meds: . acyclovir ointment   Topical Q3H while awake  . aspirin EC  81 mg Oral Daily  . cefTRIAXone (ROCEPHIN)  IV  2 g Intravenous Once   Followed by  . [START ON 02/09/2016] cefTRIAXone (ROCEPHIN)  IV  2 g Intravenous Q24H  . docusate sodium  100 mg Oral BID  . enoxaparin (LOVENOX) injection  40 mg Subcutaneous Q24H  . famotidine (PEPCID) IV  20 mg Intravenous Q12H  . heparin  5,000 Units Subcutaneous Q8H  . metroNIDAZOLE  500 mg Per Tube Q8H  . mometasone-formoterol  2 puff Inhalation BID  . naloxone      . nicotine  14 mg Transdermal Daily  . pantoprazole  40 mg Oral Daily  . sertraline  100 mg Oral QHS  . sodium chloride flush  3 mL Intravenous Q12H  . tiotropium  18 mcg Inhalation Daily   Continuous Infusions: . sodium chloride     PRN Meds:.sodium chloride, acetaminophen **OR** acetaminophen, bisacodyl, HYDROcodone-acetaminophen, ipratropium-albuterol, loperamide, morphine injection, ondansetron **OR** ondansetron (ZOFRAN) IV Past Medical History  Diagnosis Date  . Brain aneurysm   . COPD (chronic obstructive pulmonary disease) [facility])    Past Surgical History  Procedure Laterality Date  . Brain surgery     Social History   Social History  . Marital Status: Single    Spouse Name: N/A  . Number of Children: N/A  . Years of Education: N/A   Occupational History  . Not on file.   Social History Main Topics  . Smoking status: Current Every Day Smoker --  1.50 packs/day for 20 years    Types: Cigarettes  . Smokeless tobacco: Not on file  . Alcohol Use: No  . Drug Use: No  . Sexual Activity: Not Currently   Other Topics Concern  . Not on file   Social History Narrative  . No narrative on file   Allergies  Allergen Reactions  . Penicillins Hives and Swelling    Has patient had a PCN reaction causing immediate rash, facial/tongue/throat swelling, SOB or lightheadedness with hypotension: Yes Has patient had a PCN reaction causing severe rash involving mucus membranes or skin necrosis: No Has patient had a PCN reaction that required hospitalization No Has patient had a PCN reaction occurring within the last 10 years: not sure :If all of the above answers are "NO", then may proceed with Cephalosporin use.  Marland Kitchen Amoxicillin Rash    Last set of Vital Signs (not current) Filed Vitals:   February 13, 2016 1320 2016/02/13 1325  BP: 117/70 114/73  Pulse: 83   Temp:    Resp: 15 15      Physical Exam Unresponsive. Pulseless. CPR in progress.  Gen: unresponsive Cardiovascular: pulseless  Resp: apneic. Breath sounds equal bilaterally with bagging  Abd: nondistended  Neuro: GCS 3, unresponsive to pain  HEENT: No blood in posterior pharynx, gag reflex absent  Neck: No crepitus  Musculoskeletal: No deformity  Skin: warm, mottled.  Procedures  INTUBATION Performed by: Archie Balboa,  Dareen Gutzwiller Required items: required blood products, implants, devices, and special equipment available Patient identity confirmed: provided demographic data and hospital-assigned identification number Indications: Cardiopulmonary arrest Intubation method: DL 4 Mac Preoxygenation: BVM Sedatives: None Paralytic: Rocuronium Tube Size: 7-0 cuffed Post-procedure assessment: chest rise and ETCO2 monitor Breath sounds: equal and absent over the epigastrium Tube secured by Respiratory Therapy Patient tolerated the procedure well with no immediate complications.  CRITICAL  CARE Performed by: Nance Pear Total critical care time: 25 Critical care time was exclusive of separately billable procedures and treating other patients. Critical care was necessary to treat or prevent imminent or life-threatening deterioration. Critical care was time spent personally by me on the following activities: development of treatment plan with patient and/or surrogate as well as nursing, discussions with consultants, evaluation of patient's response to treatment, examination of patient, obtaining history from patient or surrogate, ordering and performing treatments and interventions, ordering and review of laboratory studies, ordering and review of radiographic studies, pulse oximetry and re-evaluation of patient's condition.  Cardiopulmonary Resuscitation (CPR) Procedure Note Directed/Performed by: Nance Pear I personally directed ancillary staff and/or performed CPR in an effort to regain return of spontaneous circulation and to maintain cardiac, neuro and systemic perfusion.    Medical Decision making  CODE BLUE was called on a patient with respiratory distress and sepsis in the hospital. Upon my arrival CPR had been initiated. Patient had 2 rounds of epinephrine and then had spontaneous return of circulation. The patient was intubated by myself. Patient was transferred to the ICU.    Nance Pear, MD 09-Mar-2016 [phone]

## 2016-02-25 NOTE — Consult Note (Signed)
PULMONARY / CRITICAL CARE MEDICINE   Name: Leah Wyatt MRN: [id number] DOB: May 15, 1961    ADMISSION DATE:  02/21/2016 CONSULTATION DATE: 6/14  REFERRING MD: Dr. Bridgett Larsson  CHIEF COMPLAINT: S/P cardiac arrest possibly aspiration event  HISTORY OF PRESENT ILLNESS:   Leah Wyatt, is a 55 year old female with past medical history significant for brain aneurysmstatus post clipping, COPD, tobacco abuse. Patient presented to ED on 6/11 with progressive weakness of right arm, status post fall. Patient was hypotensive and was noted to have urinary tract infection. Patient was started on antibiotics and was apparently doing fairly well. Patient was supposed to get discharged today. Apparently she started vomiting. Patient was found to be in asystole. CPR was performed. Patient received 2 doses of epinephrine and chest compressions for about 9 minutes. Patient was intubated on the floor and was brought to the intensive care unit for further management. On 6/14 [facility] M team took over the care as the patient is in the ICU intubated and mechanically ventilated.  PAST MEDICAL HISTORY :  She  has a past medical history of Brain aneurysm and COPD (chronic obstructive pulmonary disease) ([place]).  PAST SURGICAL HISTORY: She  has past surgical history that includes Brain surgery.  Allergies  Allergen Reactions  . Penicillins Hives and Swelling    Has patient had a PCN reaction causing immediate rash, facial/tongue/throat swelling, SOB or lightheadedness with hypotension: Yes Has patient had a PCN reaction causing severe rash involving mucus membranes or skin necrosis: No Has patient had a PCN reaction that required hospitalization No Has patient had a PCN reaction occurring within the last 10 years: not sure :If all of the above answers are "NO", then may proceed with Cephalosporin use.  Marland Kitchen Amoxicillin Rash    No current facility-administered medications on file prior to encounter.   No current  outpatient prescriptions on file prior to encounter.    FAMILY HISTORY:  Her has no family status information on file.   SOCIAL HISTORY: She  reports that she has been smoking Cigarettes.  She has a 30 pack-year smoking history. She does not have any smokeless tobacco history on file. She reports that she does not drink alcohol or use illicit drugs.  REVIEW OF SYSTEMS:   Unable to obtain  SUBJECTIVE:  Unable to obtain  VITAL SIGNS: BP 114/73 mmHg  Pulse 83  Temp(Src) 98 F (36.7 C) (Axillary)  Resp 15  Ht 5\' 4"  (1.626 m)  Wt 157 lb 6.4 oz (71.396 kg)  BMI 27.00 kg/m2  SpO2 34%  HEMODYNAMICS:    VENTILATOR SETTINGS:    INTAKE / OUTPUT: I/O last 3 completed shifts: In: 4926.3 [P.O.:360; I.V.:4116.3; IV Piggyback:450] Out: 300 [Urine:300]  PHYSICAL EXAMINATION: General:  Very sickly appearing white female Neuro: Obtunded HEENT:  Atraumatic, normocephalic, size 4 pupils nonreactive  Cardiovascular: Irregular S1 and S2, no murmur rub or gallop noted Lungs:  Clear bilaterally, no wheezes crackles or rhonchi noted Abdomen: Soft, nondistended Musculoskeletal :right humerus fracture, no other deformity noted Skin: Grossly intact  LABS:  BMET  Recent Labs Lab 02/21/2016 1619 02/06/16 0443 Feb 12, 2016 0500  NA 138 143 135  K 2.4* 3.7 4.9  CL 105 115* 108  CO2 19* 20* 15*  BUN 30* 25* 19  CREATININE 1.43* 1.02* 0.93  GLUCOSE 105* 116* 112*    Electrolytes  Recent Labs Lab 02/13/2016 1619 02/06/16 0443 02-12-16 0500 February 12, 2016 1245  CALCIUM 9.3 8.4* 7.9*  --   MG  --  2.2  --  2.3    CBC  Recent Labs Lab 01/27/2016 1619 02/03/2016 1956 02/06/16 0443 2016-02-22 0500  WBC 21.9*  --  12.4* 20.4*  HGB 9.3* 8.7* 8.6* 7.4*  HCT 28.9* 27.3* 26.4* 23.2*  PLT 513*  --  411 295    Coag's  Recent Labs Lab 02/24/2016 1619  APTT 24  INR 1.22    Sepsis Markers  Recent Labs Lab 02/21/2016 1712 02/09/2016 1956 February 22, 2016 1314  LATICACIDVEN 1.0 1.7 11.9*     ABG  Recent Labs Lab Feb 22, 2016 1323  PHART 6.63*  PCO2ART 62*  PO2ART 64*    Liver Enzymes  Recent Labs Lab 02/20/2016 1619 02/06/16 0443  AST 89* 52*  ALT 55* 42  ALKPHOS 116 105  BILITOT 0.2* 0.1*  ALBUMIN 2.7* 2.1*    Cardiac Enzymes  Recent Labs Lab 02/04/2016 1619 02-22-2016 1245  TROPONINI 0.04* 15.99*    Glucose  Recent Labs Lab 02/20/2016 1603 02/22/16 1255 Feb 22, 2016 1257 2016-02-22 1333  GLUCAP 112* 29* 74 94    Imaging Dg Chest Port 1 View  02/22/2016  CLINICAL DATA:  Status post cardiac arrest.  Hypoxia. EXAM: PORTABLE CHEST 1 VIEW COMPARISON:  February 06, 2016. FINDINGS: Endotracheal tube tip is 1.5 cm above the carina. Overlying defibrillator pads are noted. Central catheter tip is at the cavoatrial junction. There is a shunt catheter extending along the right hemothorax, present previously. No pneumothorax. There is interstitial and patchy alveolar edema throughout the lungs bilaterally. Heart is upper normal in size with pulmonary vascularity within normal limits. No adenopathy. No fractures are evident. IMPRESSION: Tube and catheter positions as described without pneumothorax. Interstitial and alveolar edema. Question congestive heart failure versus noncardiogenic edema. Both cardiogenic and noncardiogenic etiologies for edema may occur concurrently. Electronically Signed   By: Lowella Grip III M.D.   On: 22-Feb-2016 14:17     STUDIES:  01/1411-lead EKG> 6/11 CT head>No acute intracranial abnormality. . Status post aneurysm clipping within the frontal lobe, likely anteriorcommunicatingartery.Surroundingencephalomalacia..Right-sidedVPshuntcatheterinplacewithouthydrocephalus.4. Small vessel ischemic change 6/12 CT shoulder> Right humerusfracture  CULTURES: 6/14 sputum culture 6/11 blood cultures>> positive for Staphylococcus species  6/11 urine culture >. positive for Klebsiella pneumoniae   ANTIBIOTICS: 6/14 ceftriaxone>> 6/14  Flagyl>>  SIGNIFICANT EVENTS: 6/14 patient was intubated status post cardiac arrest   LINES/TUBES: 6/14 ET tube  PICC line>>  DISCUSSION: 55 years old female with a history of COPD, brain aneurysm status post clipping, tobacco abuse now intubated and mechanically ventilated status post cardiac arrest.  ASSESSMENT / PLAN:  PULMONARY A: Aspiration pneumonia Acute hypoxemic, hypercarbic respiratory failure status post cardiac arrest History of COPD Tobacco abuse CT scan concerning for mediastinal lymphadenopathy and mass P:   Full vent support Spontaneous breathing trials when able Routine ABG CXR in a.m. Bronchodilators Flagyl per ID recommendation Oncology following  CARDIOVASCULAR A:  Cardiac arrest P:  Continuous telemetry Keep map goals greater than 65 LR at 75 L per hour Serial troponin EKG Follow Routine echo RENAL A:   No active issues P:   Chem-7 in a.m. Replace electrolytes per ICU protocol  GASTROINTESTINAL A:   No active issues P:  Nothing by mouth for now  Pepcid for GI prophylaxis  HEMATOLOGIC A:   No active issues P:  Transfuse if H&H less than 7 Lovenox for DVT prophylaxis  INFECTIOUS A:   Leukocytosis related to UTI P:   Trend CBC Ceftriaxone/Flagyl   ENDOCRINE A:   No active issues  P:   Blood sugar checks with BMP  NEUROLOGIC  A:  Acute metabolic encephalopathy due to respiratory failure  Brain aneurysm status post clipping Multiple falls with right humerus fracture P:   RASS goal 0 to -1 Normothermia protocol CT head in a.m.   FAMILY  - Updates: Family was updated by Dr. Shawna Orleans.   Bincy Varughese,AG-ACNP Pulmonary and [place] Pager: [phone]  03-07-2016, 3:07 PM   PCCM ATTENDING ATTESTATION:  I have evaluated patient with ANP Patria Mane, reviewed database in its entirety and discussed care plan in detail. In addition, this patient was discussed on multidisciplinary  rounds. I spent 30 minutes with family discussing the possible causes of her cardiac arrest and where we go from her. I indicated that the prognosis was guarded @ best   CCM time: 40 mins The above time includes time spent in consultation with patient and/or family members and reviewing care plan on multidisciplinary rounds  Merton Border, MD PCCM service Mobile [phone] Pager [phone] 03-07-16

## 2016-02-25 NOTE — Plan of Care (Signed)
Lying in bed with no s/s of distress noted. VSS except O2 sat 88% on RA. Per PRN order, placed on 2L O2 via [place]. O2 sat increased to 92%. RT called for breathing treatment. O2 sat 98% on 2L O2 post Duoneb. NSR with HR 91. Family at bedside. Pace Nurse at bedside. MD making rounds. MD notified of O2 sat and placing patient on 2L O2 via [place]. Repositioned right shoulder for comfort. Continuing to complain of right shoulder pain. Hydrocodone-acetaminophen 2 (two) tablets given for shoulder pain. Family and Pace Nurse out of room at present time. RN set up meal tray for lunch. Patient situated for lunch. No complaints at this time. Nursing Station received alert from [place] regarding Asystole. Patient unresponsive and blue. Initiated CPR. No pulse. After positive pulse, intubated and transferred to ICU. Report given at bedside to ICU Nurse. Spoke with family and Copywriter, advertising regarding status. No unanswered questions.

## 2016-02-25 NOTE — ED Provider Notes (Signed)
[facility]  Department of Emergency Medicine   Code Blue CONSULT NOTE  Chief Complaint: Cardiac arrest/unresponsive   Level V Caveat: Unresponsive  History of present illness: I was contacted by the hospital for a CODE BLUE cardiac arrest upstairs and presented to the patient's bedside.    ROS: Unable to obtain, Level V caveat  Scheduled Meds: . aspirin  81 mg Per Tube Daily  . [START ON 02/09/2016] cefTRIAXone (ROCEPHIN)  IV  2 g Intravenous Q24H  . enoxaparin (LOVENOX) injection  40 mg Subcutaneous Q24H  . famotidine (PEPCID) IV  20 mg Intravenous Q12H  . ipratropium-albuterol  3 mL Nebulization Q6H  . metroNIDAZOLE  500 mg Per Tube Q8H  . sodium chloride  250 mL Intravenous Once  . sodium chloride flush  3 mL Intravenous Q12H   Continuous Infusions: . epinephrine 0.5 mcg/min (2016/02/16 1730)  . lactated ringers    . lactated ringers 75 mL/hr at February 16, 2016 1543  . norepinephrine     PRN Meds:.sodium chloride, acetaminophen **OR** [DISCONTINUED] acetaminophen, bisacodyl, fentaNYL (SUBLIMAZE) injection, fentaNYL (SUBLIMAZE) injection, midazolam, midazolam, ondansetron **OR** ondansetron (ZOFRAN) IV Past Medical History  Diagnosis Date  . Brain aneurysm   . COPD (chronic obstructive pulmonary disease) [facility])    Past Surgical History  Procedure Laterality Date  . Brain surgery     Social History   Social History  . Marital Status: Single    Spouse Name: N/A  . Number of Children: N/A  . Years of Education: N/A   Occupational History  . Not on file.   Social History Main Topics  . Smoking status: Current Every Day Smoker -- 1.50 packs/day for 20 years    Types: Cigarettes  . Smokeless tobacco: Not on file  . Alcohol Use: No  . Drug Use: No  . Sexual Activity: Not Currently   Other Topics Concern  . Not on file   Social History Narrative  . No narrative on file   Allergies  Allergen Reactions  . Penicillins Hives and Swelling    Has patient  had a PCN reaction causing immediate rash, facial/tongue/throat swelling, SOB or lightheadedness with hypotension: Yes Has patient had a PCN reaction causing severe rash involving mucus membranes or skin necrosis: No Has patient had a PCN reaction that required hospitalization No Has patient had a PCN reaction occurring within the last 10 years: not sure :If all of the above answers are "NO", then may proceed with Cephalosporin use.  Marland Kitchen Amoxicillin Rash    Last set of Vital Signs (not current) Filed Vitals:   2016/02/16 1700 02/16/16 1800  BP:    Pulse: 144   Temp: 97 F (36.1 C) 96.4 F (35.8 C)  Resp: 39 0      Physical Exam  Gen: unresponsive Cardiovascular: pulseless  Resp: apneic. Breath sounds equal bilaterally with bagging, Good condensation in the ET tube with bagging. Abd: nondistended  Neuro: GCS 3, unresponsive to pain  HEENT: No blood in posterior pharynx, gag reflex absent  Neck: No crepitus  Musculoskeletal: No deformity  Skin: cool to the touch  Procedures    CRITICAL CARE Performed by: Loura Pardon A Total critical care time: 20 minutes Critical care time was exclusive of separately billable procedures and treating other patients. Critical care was necessary to treat or prevent imminent or life-threatening deterioration. Critical care was time spent personally by me on the following activities: development of treatment plan with patient and/or surrogate as well as nursing, discussions with  consultants, evaluation of patient's response to treatment, examination of patient, obtaining history from patient or surrogate, ordering and performing treatments and interventions, ordering and review of laboratory studies, ordering and review of radiographic studies, pulse oximetry and re-evaluation of patient's condition.  Cardiopulmonary Resuscitation (CPR) Procedure Note  Directed/Performed by: Joanne Gavel I personally directed ancillary staff and/or performed CPR  in an effort to regain return of spontaneous circulation and to maintain cardiac, neuro and systemic perfusion.    Medical Decision making  CODE BLUE was called and I responded. The patient had bradycardic/PE a arrest. She received 2 rounds of epinephrine, she also received sodium bicarbonate and calcium gluconate after which she had spontaneous return of circulation. Her initial systolic blood pressure after code was in the 90s. She then experienced a recurrent episode of bradycardia which was responsive to atropine. Critical care nurse practitioner Varughese arrived, discussed the case with her. Epi drip was ordered as discussed with the NP and Dr. Alva Garnet. Patient  again became bradycardic and lost pulses, CPR initiated, she received additional atropine and epinephrine after which she again regained spontaneous circulation.   Assessment and Plan  Care was transferred to the ICU team including nurse practitioner Varughese and attending Dr. Alva Garnet for further management.   Joanne Gavel, MD February 26, 2016 [phone]

## 2016-02-25 NOTE — Progress Notes (Signed)
[place] [facility] [place] NAME: Leah Wyatt    MR#:  [id number]  DATE OF BIRTH:  [birth year]  SUBJECTIVE:  CHIEF COMPLAINT:   Chief Complaint  Patient presents with  . Code Stroke   The patient had no complaint this am, but she was found unresponsive and blue later. No pulses were found, CPR was performed. Per nurse aid, pt vomited this am. REVIEW OF SYSTEMS:  Unable to ger ROS.  DRUG ALLERGIES:   Allergies  Allergen Reactions  . Penicillins Hives and Swelling    Has patient had a PCN reaction causing immediate rash, facial/tongue/throat swelling, SOB or lightheadedness with hypotension: Yes Has patient had a PCN reaction causing severe rash involving mucus membranes or skin necrosis: No Has patient had a PCN reaction that required hospitalization No Has patient had a PCN reaction occurring within the last 10 years: not sure :If all of the above answers are "NO", then may proceed with Cephalosporin use.  Marland Kitchen Amoxicillin Rash    VITALS:  Blood pressure 136/72, pulse 91, temperature 97.6 F (36.4 C), temperature source Oral, resp. rate 18, height 5\' 4"  (1.626 m), weight 157 lb 6.4 oz (71.396 kg), SpO2 98 %.  PHYSICAL EXAMINATION:  GENERAL:  55 y.o.-year-old patient lying in the bed, unresponsive and intubated. EYES: Pupils equal, round, no dilation but no reactive to light. No scleral icterus.  HEENT: Head atraumatic, normocephalic. NECK:  Supple, no jugular venous distention. LUNGS: Normal breath sounds bilaterally, bilateral wheezing, crackles and rhonchi. No use of accessory muscles of respiration.  CARDIOVASCULAR: S1, S2 normal. No murmurs, rubs, or gallops.  ABDOMEN: Soft, nontender, nondistended. Bowel sounds are weak.  EXTREMITIES: No pedal edema, cyanosis, or clubbing.  NEUROLOGIC: unable to exam.  PSYCHIATRIC: The patient is intubated. SKIN: No obvious rash, lesion, or ulcer. Burt pale.   LABORATORY PANEL:    CBC  Recent Labs Lab 03/08/16 0500  WBC 20.4*  HGB 7.4*  HCT 23.2*  PLT 295   ------------------------------------------------------------------------------------------------------------------  Chemistries   Recent Labs Lab 02/06/16 0443 03/08/2016 0500  NA 143 135  K 3.7 4.9  CL 115* 108  CO2 20* 15*  GLUCOSE 116* 112*  BUN 25* 19  CREATININE 1.02* 0.93  CALCIUM 8.4* 7.9*  MG 2.2  --   AST 52*  --   ALT 42  --   ALKPHOS 105  --   BILITOT 0.1*  --    ------------------------------------------------------------------------------------------------------------------  Cardiac Enzymes  Recent Labs Lab 02/12/2016 1619  TROPONINI 0.04*   ------------------------------------------------------------------------------------------------------------------  RADIOLOGY:  Ct Shoulder Right Wo Contrast  02/06/2016  CLINICAL DATA:  Right humeral pain, fall from bed about 2 days ago. Fracture. EXAM: CT OF THE RIGHT SHOULDER WITHOUT CONTRAST TECHNIQUE: Multidetector CT imaging was performed according to the standard protocol. Multiplanar CT image reconstructions were also generated. COMPARISON:  Humerus radiographs from 01/28/2016 FINDINGS: There is a 2 part surgical neck fracture of the left proximal humerus with abduction of the humeral head component with respect to the shaft component, and with nondisplaced fractures in the greater tuberosity but not a displaced fracture. That said, there is considerable comminution along the dominant fracture plane, with multiple additional bony fragments some of which are imbedded along the fracture plane, and multiple external bony fragments. The largest fragment separate from the head in shaft components is a 3.6 cm fragment on image 48/4, which contains cortical bone and which partially extends along the fracture plane. I do not observe a  clavicular or scapular fracture. No left upper rib fracture is seen. Degenerative sternoclavicular arthropathy  noted on the right and there is degenerative spurring of the right AC joint. Extensive edema tracks in the subcutaneous tissues the right shoulder. There is also edema tracking superficial to the right pectoralis musculature. Several fragments are present adjacent to the distal right pectoralis tendon which otherwise seems to attach to the shaft fragment. A right lower paratracheal lymph node measures 1.7 cm in short axis on image 56/9. A separate right lower paratracheal node measures 1.1 cm in short axis on image 48/9. I suspect a subcarinal mass measuring 3.4 cm on image 84/9, although conceivably some type of hiatal hernia might simulated mass in this location. Emphysema noted in the right upper lung. Aortic atherosclerotic calcification is present. Rim calcified 2 cm in long axis left thyroid nodule with other thyroid nodules observed. IMPRESSION: 1. From a technical standpoint the right proximal humeral surgical neck fracture qualifies as a 2 part fracture, as the fracture plane extending through the greater tuberosity is not displaced but the surgical neck fracture itself is very displaced. However, the surgical neck fracture is considerably comminuted, with multiple fragments projecting along the fracture plane which might impede healing in a conservative management scenario. I do not see a definite underlying bony lesion to suggest that this fracture was pathologic in nature (I am aware of the patient's destructive right superior pubic ramus lesion). 2. Extensive subcutaneous edema along the right shoulder and along the pectoralis musculature. Some of this edema tracks along the right axillary neurovascular structures. 3. There is abnormal pathologic adenopathy in the mediastinum. Possible subcarinal mass. This could be a manifestation of lymphoma, lung cancer, or other malignancy. It may well be related to the lytic lesion in the right superior pubic ramus. I recommend dedicated chest CT (with contrast if  feasible) for further workup, and inclusion of the abdomen and pelvis probably be a good idea. 4. Emphysema. 5. Right-sided thyroid nodules. Electronically Signed   By: Van Clines M.D.   On: 02/06/2016 15:00   Dg Chest Port 1 View  02/06/2016  CLINICAL DATA:  Status post PICC line placement EXAM: PORTABLE CHEST 1 VIEW COMPARISON:  01/31/2016 FINDINGS: Cardiac shadow is stable. A new left-sided PICC line is noted with catheter tip at the cavoatrial junction. Ventriculoperitoneal shunt catheter is again seen and stable. Fracture in the proximal right humerus is again identified. IMPRESSION: Status post PICC line placement in satisfactory PICC position Electronically Signed   By: Inez Catalina M.D.   On: 02/06/2016 20:45    EKG:   Orders placed or performed during the hospital encounter of 02/15/2016  . ED EKG  . ED EKG  . EKG 12-Lead  . EKG 12-Lead  . ED EKG 12-Lead  . ED EKG 12-Lead    ASSESSMENT AND PLAN:   Acute respiratory failure, possible due to aspiration secondary to vomiting. The patient was found unresponsive and blue. No pulses were found, CPR was performed, code blue was called. Epi x 2 were given. Heart rate resumed after 9 min CPR. She was intubated. BS 29, repeat 74. Given D50 iv. Crackles and wheezing in both lungs. Check CXR, CBC, BMP, troponin, lactic acid.       Acute metabolic encephalopathy due to acute respiratory failure.  Sepsis and UTI (urinary tract infection), bacteremia. Worsening leukocytosis. Continue levaquin, discontinued azatam, urin culture: GNR. B/C: GRAM POSITIVE COCCI  IN BOTH AEROBIC AND ANAEROBIC BOTTLES.  F/u ID consult. Started  vancomycin PTD. F/u CBC.  Right humeral fracture. Per Dr. Mack Guise, continue to use her right shoulder immobilizer.  ARF due to dehydration. Improved.  Hypokalemia. Improved.  COPD. Not on ventilation. Metastatic lymphadenopathy: Unclear etiology.  Per Dr. Grayland Ormond, will get a dedicated chest CT to  further evaluate. We will also get an abdominal and pelvic CT for completeness. Ultimately, patient may require biopsy but this can be accomplished as an outpatient. Peripheral blood flow cytometry has been ordered and is pending.  I discussed with ICU NP. Dr. Leonidas Romberg will see the pt in 10 minutes. Transfer to intensivist service.  All the records are reviewed and case discussed with Care Management/Social Workerr. Management plans discussed with the patient, her sister and mother and they are in agreement.  CODE STATUS: full code at this time per her sister.  TOTAL Critical TIME TAKING CARE OF THIS PATIENT: 76 minutes.  Greater than 50% time was spent on coordination of care and face-to-face counseling.  POSSIBLE D/C IN ? DAYS, DEPENDING ON CLINICAL CONDITION.   Demetrios Loll M.D on 03-04-16 at 1:23 PM  Between 7am to 6pm - Pager - [phone]  After 6pm go to [email or web address] - password [email or web address]  [facility]  Office  [phone]  CC: Primary care physician; No primary care provider on file.

## 2016-02-25 NOTE — Progress Notes (Signed)
Spoke with Murlean Caller from [facility], patient is not a candidate for donor services. Case closed. Referral number [id number]

## 2016-02-25 NOTE — Progress Notes (Signed)
During code blue assisted  by bagging with face mask with 100% oxygen and assisting the ED physician while he intubated the patient.  Transported the patient to ICU while continue to bag with 100% oxygen connected to ETT.  Patient connected to ventilator by ICU respiratory therapist.

## 2016-02-25 NOTE — Progress Notes (Signed)
Per the verbal order from Dr. Alva Garnet, the ETT was pulled back 3 cm to 20 at the lip.

## 2016-02-25 NOTE — Clinical Social Work Note (Signed)
CSW received phone call from [place] at [facility] this morning stating that they have been thinking about things and are thinking that patient would now be better off going to rehab for a few days. CSW voiced disappointment to Jackelyn Poling in regards to this being the day of discharge and due to patient was hoping to return home and was very happy when she was told [facility] had decided she could return home. CSW requested that Debbie contact the patient in her hospital room to inform her of their change in plans. If STR is decided upon by [facility], patient will have a bed today. Shela Leff MSW,LCSW [phone]

## 2016-02-25 NOTE — Progress Notes (Signed)
eLink Physician-Brief Progress Note Patient Name: Leah Wyatt DOB: 02/10/1961 MRN: [id number]   Date of Service  2016/02/27  HPI/Events of Note  Cardiac arrest earlier today Now she has had another arrest She has been completely unresponsive with fixed, dilated pupils since the initial cardiac arrest Now with shock, severe hypoxemia Discussed with Dr. Alva Garnet and chart reviewed. Family has agreed to DNR status  eICU Interventions  I advised that there is no role for vasopressors as they will not be medically beneficial.  Degree of neurologic injury is severe.  NP will discuss withdrawal of care with family     Intervention Category Major Interventions: Code management / supervision  Simonne Maffucci 02/27/2016, 5:34 PM

## 2016-02-25 NOTE — Progress Notes (Signed)
PT suffered repeated cardiac arrest. ACNP Varughese updated family and they decided no further ACLS. Upon my arrival pt progressed to asystole and is pronounced deceased @ 37 on this day 2016/03/06  Merton Border, MD PCCM service Mobile [phone] Pager [phone] Mar 06, 2016

## 2016-02-25 NOTE — Clinical Social Work Note (Signed)
CSW in ICU during Code Blue to provide support for patient's mother and sister. Patient's mother and sister decided to not have the medical team continue compressions. Patient not expected to survive. Shela Leff MSW,LCSW [phone]

## 2016-02-25 NOTE — Care Management (Addendum)
Patient active with [facility]. Reviewed CSW note.  Code blue, intubated. Following.

## 2016-02-25 DEATH — deceased

## 2016-07-07 IMAGING — CT CT SHOULDER*R* W/O CM
2 series · 10 of 14 positions shown, 12 images · non-contrast
Comparison: Humerus radiographs from 02/05/2016

CLINICAL DATA: Right humeral pain, fall from bed about 2 days ago.
Fracture.

EXAM:
CT OF THE RIGHT SHOULDER WITHOUT CONTRAST
TECHNIQUE: Multidetector CT imaging was performed according to the standard
protocol. Multiplanar CT image reconstructions were also generated.

[Series 8: bone axial · axial · 0.39mm/px · z∈[-176,-57]mm · 5 of 106 slices shown]
[im 18/106  bone]
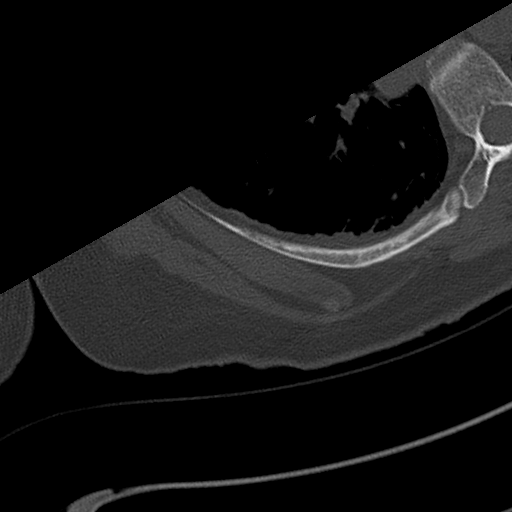
[im 36/106  bone]
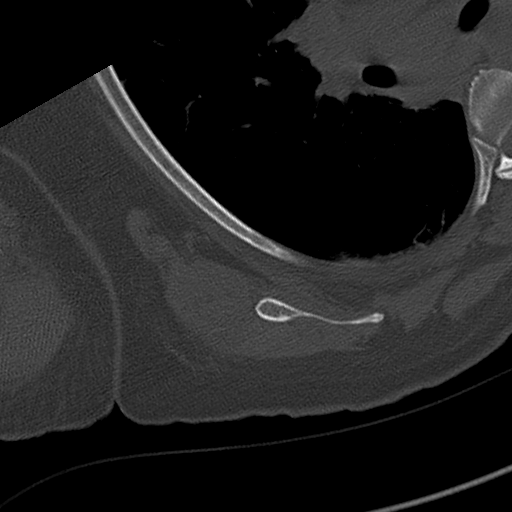
[im 53/106  bone]
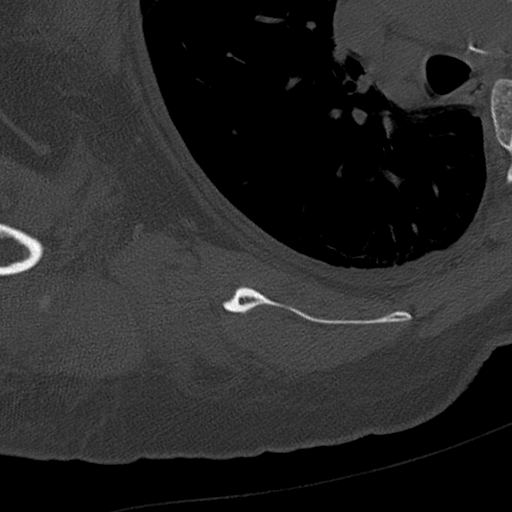
[im 71/106  bone]
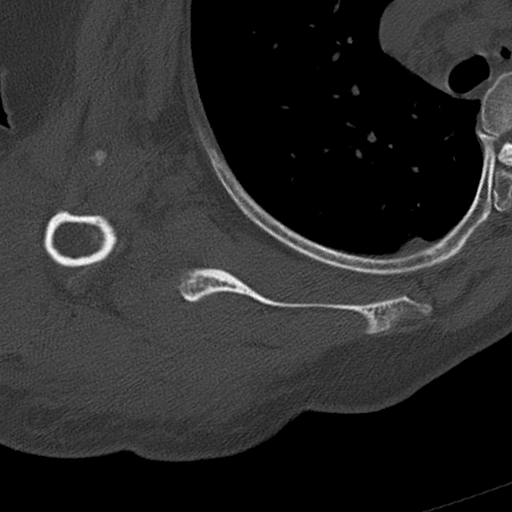
[im 88/106  bone]
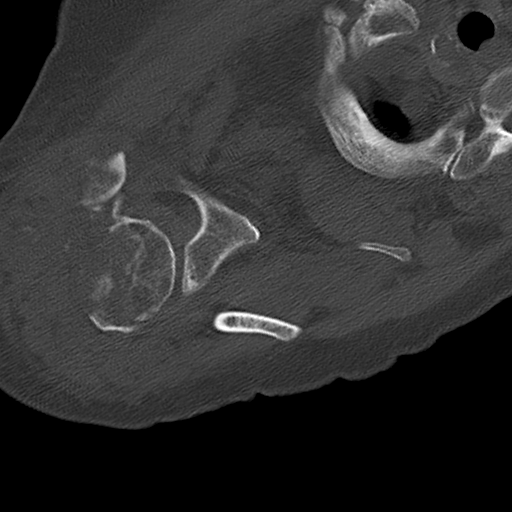

[Series 9: st axial · axial · 0.39mm/px · z∈[-188,-52]mm · 5 of 120 slices shown, 7 images]
[im 20/120  soft-tissue]
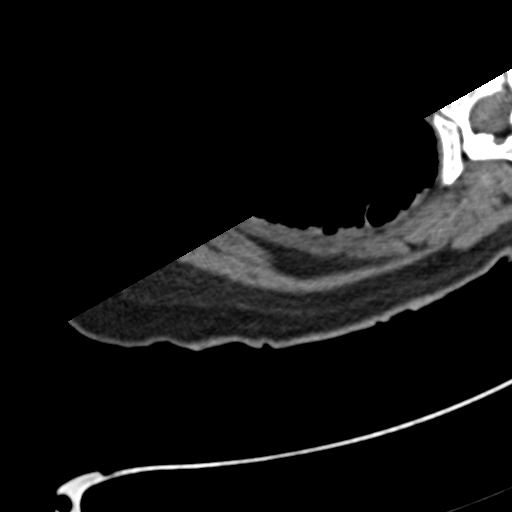
[im 20/120  bone]
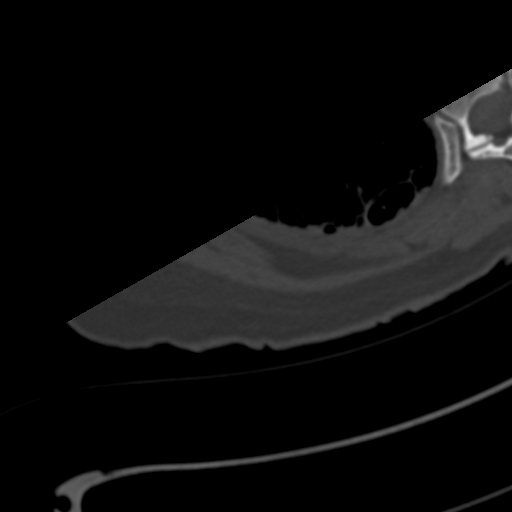
[im 40/120  bone]
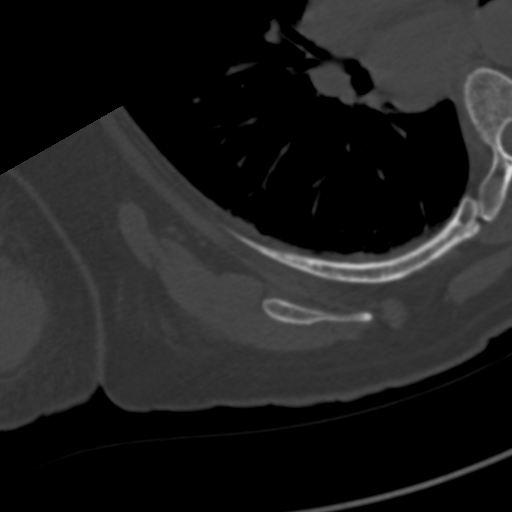
[im 60/120  bone]
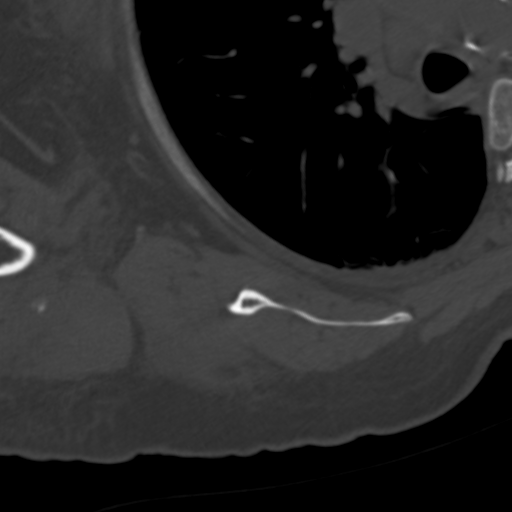
[im 80/120  bone]
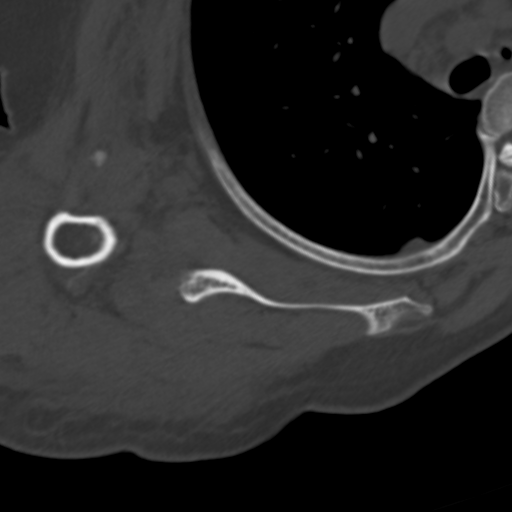
[im 100/120  soft-tissue]
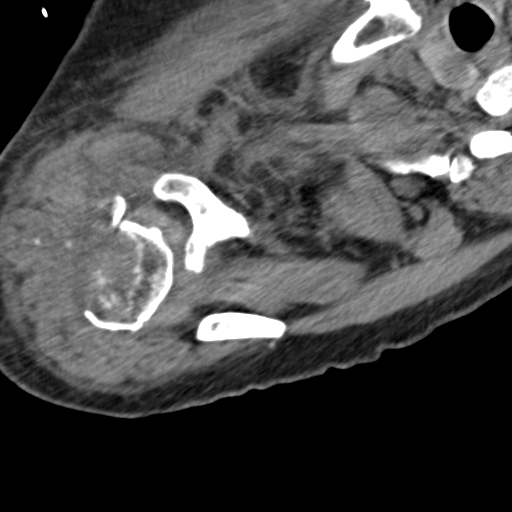
[im 100/120  bone]
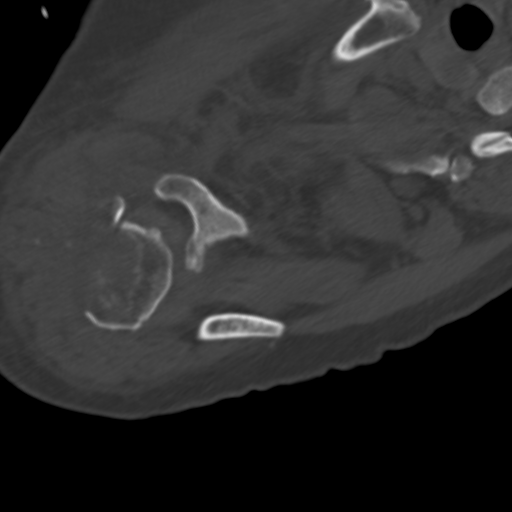

[10 of 14 positions shown; findings below may reference images not displayed]

FINDINGS: There is a 2 part surgical neck fracture of the left proximal
humerus with abduction of the humeral head component with respect to
the shaft component, and with nondisplaced fractures in the greater
tuberosity but not a displaced fracture. That said, there is
considerable comminution along the dominant fracture plane, with
multiple additional bony fragments some of which are imbedded along
the fracture plane, and multiple external bony fragments. The
largest fragment separate from the head in shaft components is a
cm fragment on image 48/4, which contains cortical bone and which
partially extends along the fracture plane.

I do not observe a clavicular or scapular fracture. No left upper
rib fracture is seen. Degenerative sternoclavicular arthropathy
noted on the right and there is degenerative spurring of the right
AC joint. Extensive edema tracks in the subcutaneous tissues the
right shoulder. There is also edema tracking superficial to the
right pectoralis musculature. Several fragments are present adjacent
to the distal right pectoralis tendon which otherwise seems to
attach to the shaft fragment.

A right lower paratracheal lymph node measures 1.7 cm in short axis
on image 56/9. A separate right lower paratracheal node measures
cm in short axis on image 48/9. I suspect a subcarinal mass
measuring 3.4 cm on image 84/9, although conceivably some type of
hiatal hernia might simulated mass in this location.

Emphysema noted in the right upper lung.

Aortic atherosclerotic calcification is present. Rim calcified 2 cm
in long axis left thyroid nodule with other thyroid nodules
observed.
IMPRESSION: 1. From a technical standpoint the right proximal humeral surgical
neck fracture qualifies as a 2 part fracture, as the fracture plane
extending through the greater tuberosity is not displaced but the
surgical neck fracture itself is very displaced. However, the
surgical neck fracture is considerably comminuted, with multiple
fragments projecting along the fracture plane which might impede
healing in a conservative management scenario. I do not see a
definite underlying bony lesion to suggest that this fracture was
pathologic in nature (I am aware of the patient's destructive right
superior pubic ramus lesion).
2. Extensive subcutaneous edema along the right shoulder and along
the pectoralis musculature. Some of this edema tracks along the
right axillary neurovascular structures.
3. There is abnormal pathologic adenopathy in the mediastinum.
Possible subcarinal mass. This could be a manifestation of lymphoma,
lung cancer, or other malignancy. It may well be related to the
lytic lesion in the right superior pubic ramus. I recommend
dedicated chest CT (with contrast if feasible) for further workup,
and inclusion of the abdomen and pelvis probably be a good idea.
4. Emphysema.
5. Right-sided thyroid nodules.

## 2016-07-09 IMAGING — DX DG ABDOMEN 1V
1 series · 1 of 1 positions shown · non-contrast
Comparison: None.

CLINICAL DATA: Enteric tube placement

EXAM:
ABDOMEN - 1 VIEW

[abdomen kub]
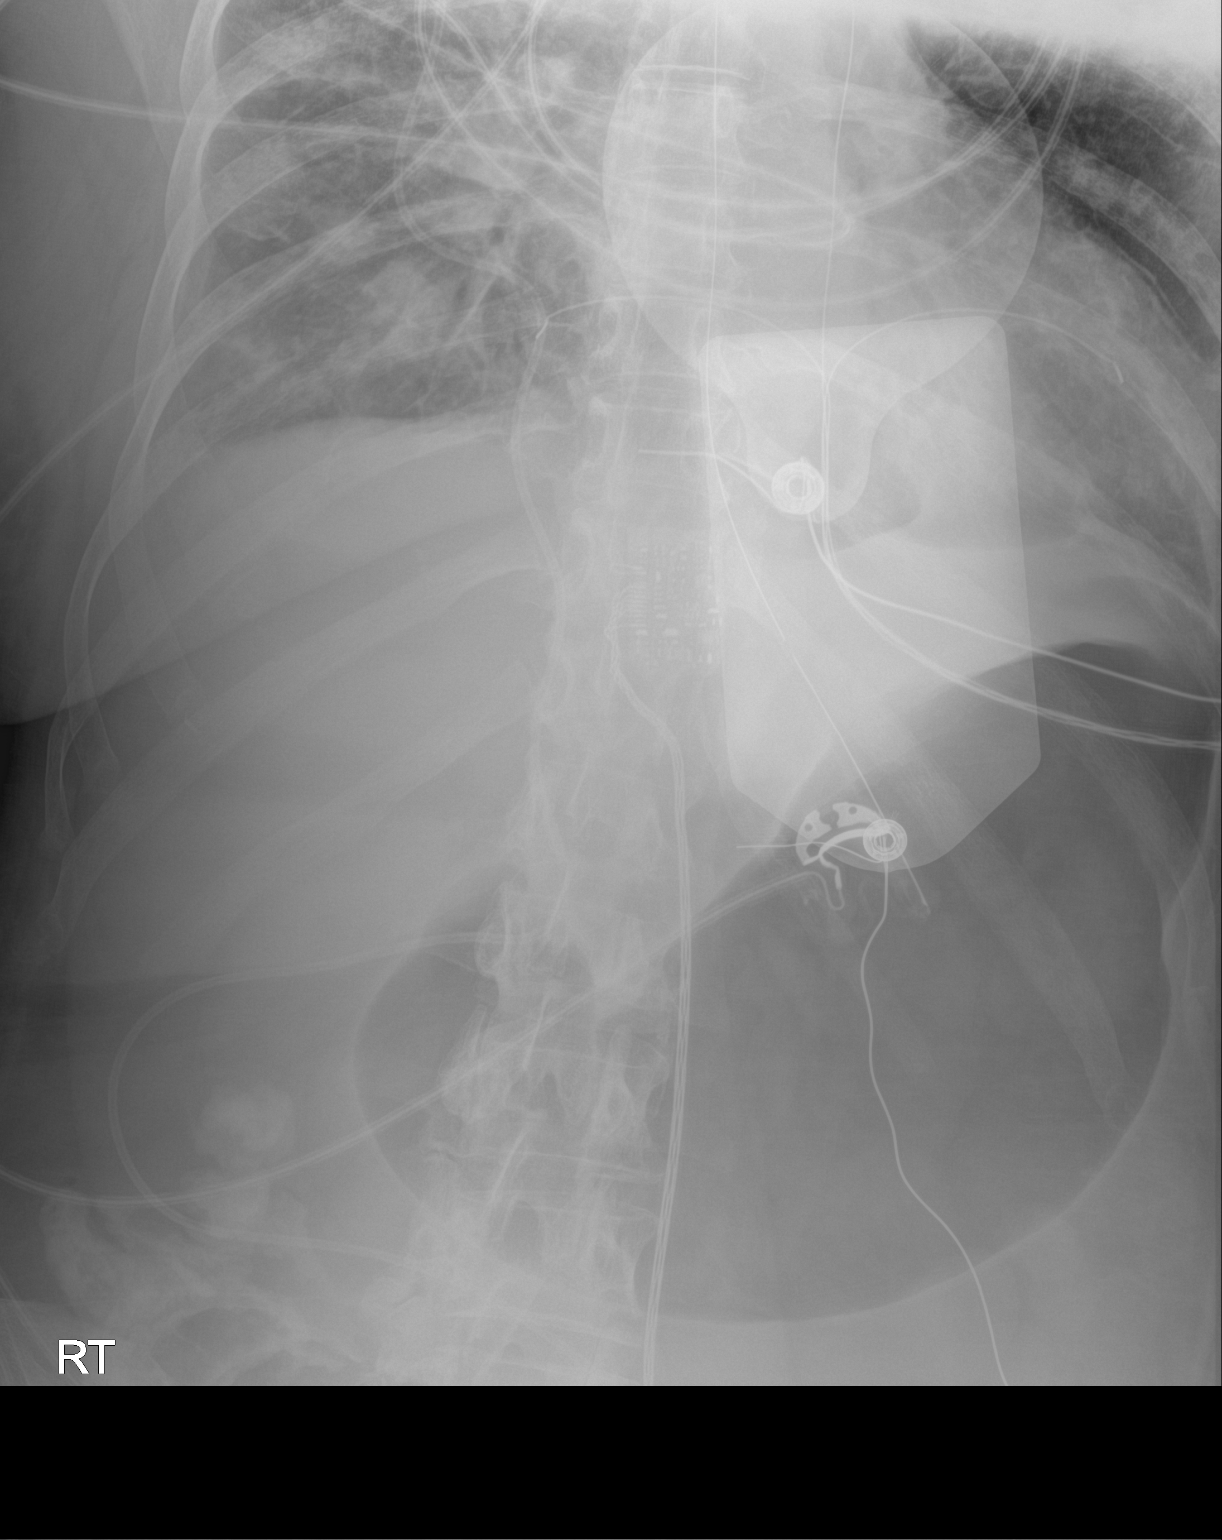

[1 of 1 positions shown; findings below may reference images not displayed]

FINDINGS: Enteric tube tip is in the very proximal stomach with the side port
near the esophagogastric junction, consider advancing 5 cm. Mild
gaseous distention of the stomach. No disproportionately dilated
small bowel loops in the visualized upper abdomen. Bibasilar patchy
lung opacities and small bilateral pleural effusions. Right-sided
ventriculoperitoneal shunt is partially visualized entering the
pelvis, with the tip not seen on this image.
IMPRESSION: Enteric tube tip is in the very proximal stomach with the side port
near the esophagogastric junction, consider advancing 5 cm.

Patchy bibasilar lung opacities and small bilateral pleural
effusions, correlate with chest radiograph.

These results will be called to the ordering clinician or
representative by the Radiologist Assistant, and communication
documented in the PACS or zVision Dashboard.
